# Patient Record
Sex: Male | Born: 1975 | Race: White | Hispanic: No | Marital: Married | Smoking: Never smoker
Health system: Southern US, Community
[De-identification: ages and names within clinical notes are randomized; demographics above are authoritative.]

## PROBLEM LIST (undated history)

## (undated) DIAGNOSIS — G4730 Sleep apnea, unspecified: Secondary | ICD-10-CM

## (undated) DIAGNOSIS — K219 Gastro-esophageal reflux disease without esophagitis: Secondary | ICD-10-CM

## (undated) DIAGNOSIS — E785 Hyperlipidemia, unspecified: Secondary | ICD-10-CM

## (undated) DIAGNOSIS — T7840XA Allergy, unspecified, initial encounter: Secondary | ICD-10-CM

## (undated) DIAGNOSIS — I10 Essential (primary) hypertension: Secondary | ICD-10-CM

## (undated) DIAGNOSIS — J45909 Unspecified asthma, uncomplicated: Secondary | ICD-10-CM

## (undated) HISTORY — DX: Essential (primary) hypertension: I10

## (undated) HISTORY — PX: NO PAST SURGERIES: SHX2092

## (undated) HISTORY — DX: Unspecified asthma, uncomplicated: J45.909

## (undated) HISTORY — DX: Sleep apnea, unspecified: G47.30

## (undated) HISTORY — DX: Gastro-esophageal reflux disease without esophagitis: K21.9

## (undated) HISTORY — DX: Hyperlipidemia, unspecified: E78.5

## (undated) HISTORY — DX: Allergy, unspecified, initial encounter: T78.40XA

---

## 2016-09-01 ENCOUNTER — Encounter: Payer: Self-pay | Admitting: Family Medicine

## 2016-09-01 ENCOUNTER — Telehealth: Payer: Self-pay | Admitting: Family Medicine

## 2016-09-01 ENCOUNTER — Ambulatory Visit (INDEPENDENT_AMBULATORY_CARE_PROVIDER_SITE_OTHER): Payer: 59 | Admitting: Family Medicine

## 2016-09-01 VITALS — BP 129/86 | HR 109 | Temp 98.0°F | Resp 20 | Ht 69.0 in | Wt 308.5 lb

## 2016-09-01 DIAGNOSIS — I10 Essential (primary) hypertension: Secondary | ICD-10-CM

## 2016-09-01 DIAGNOSIS — Z7689 Persons encountering health services in other specified circumstances: Secondary | ICD-10-CM

## 2016-09-01 DIAGNOSIS — Z6841 Body Mass Index (BMI) 40.0 and over, adult: Secondary | ICD-10-CM

## 2016-09-01 DIAGNOSIS — R000 Tachycardia, unspecified: Secondary | ICD-10-CM

## 2016-09-01 DIAGNOSIS — R05 Cough: Secondary | ICD-10-CM | POA: Diagnosis not present

## 2016-09-01 DIAGNOSIS — R059 Cough, unspecified: Secondary | ICD-10-CM

## 2016-09-01 HISTORY — DX: Tachycardia, unspecified: R00.0

## 2016-09-01 HISTORY — DX: Morbid (severe) obesity due to excess calories: E66.01

## 2016-09-01 HISTORY — DX: Essential (primary) hypertension: I10

## 2016-09-01 LAB — CBC WITH DIFFERENTIAL/PLATELET
BASOS PCT: 0.3 % (ref 0.0–3.0)
Basophils Absolute: 0 10*3/uL (ref 0.0–0.1)
EOS PCT: 3.1 % (ref 0.0–5.0)
Eosinophils Absolute: 0.3 10*3/uL (ref 0.0–0.7)
HCT: 46.5 % (ref 39.0–52.0)
Hemoglobin: 15.9 g/dL (ref 13.0–17.0)
LYMPHS ABS: 2.8 10*3/uL (ref 0.7–4.0)
Lymphocytes Relative: 26.4 % (ref 12.0–46.0)
MCHC: 34.3 g/dL (ref 30.0–36.0)
MCV: 87.9 fl (ref 78.0–100.0)
MONO ABS: 0.9 10*3/uL (ref 0.1–1.0)
MONOS PCT: 8.7 % (ref 3.0–12.0)
NEUTROS PCT: 61.5 % (ref 43.0–77.0)
Neutro Abs: 6.6 10*3/uL (ref 1.4–7.7)
Platelets: 379 10*3/uL (ref 150.0–400.0)
RBC: 5.29 Mil/uL (ref 4.22–5.81)
RDW: 14.2 % (ref 11.5–15.5)
WBC: 10.8 10*3/uL — ABNORMAL HIGH (ref 4.0–10.5)

## 2016-09-01 LAB — BASIC METABOLIC PANEL
BUN: 22 mg/dL (ref 6–23)
CALCIUM: 10.4 mg/dL (ref 8.4–10.5)
CO2: 31 mEq/L (ref 19–32)
CREATININE: 1.2 mg/dL (ref 0.40–1.50)
Chloride: 98 mEq/L (ref 96–112)
GFR: 71.76 mL/min (ref >60.00)
GLUCOSE: 116 mg/dL — AB (ref 70–99)
Potassium: 4 mEq/L (ref 3.5–5.1)
SODIUM: 137 meq/L (ref 135–145)

## 2016-09-01 LAB — MICROALBUMIN / CREATININE URINE RATIO
CREATININE, U: 387.7 mg/dL
MICROALB/CREAT RATIO: 0.7 mg/g (ref 0.0–30.0)
Microalb, Ur: 2.9 mg/dL — ABNORMAL HIGH (ref 0.0–1.9)

## 2016-09-01 MED ORDER — LOSARTAN POTASSIUM-HCTZ 100-25 MG PO TABS: 1.0000 | ORAL_TABLET | Freq: Every day | ORAL | 0 refills | Status: DC

## 2016-09-01 NOTE — Progress Notes (Signed)
Patient ID: Christian Escobar, male  DOB: 08-13-1976, 40 y.o.   MRN: [id number] Patient Care Team    Relationship Specialty Notifications Start End  Natalia Leatherwoodenee A Kuneff, DO PCP - General Family Medicine  09/01/16     Subjective:  Christian Escobar is a 40 y.o.  male present for new patient establishment. All past medical history, surgical history, allergies, family history, immunizations, medications and social history were obtained and entered in the electronic medical record today. All recent labs, ED visits and hospitalizations within the last year were reviewed.  Moved from [place] about 2015. He has not established with any provider. Recently seen in UC for pneumonia.  Pneumonia: Pt was recently seen at [facility] and diagnosed with pneumonia. He received steroid, rocephin and azith treatment. He states he is completely improved since treatment. He did have follow-up 2 occassions since. He denies fever, chills, nausea, vomit or fatigue. He does endorse a continued cough.   Hypertension: During his UC visits he was also diagnosed with hypertension. He was started on lisinopril/hctz 20-25 and tolerating medicaiton well. He reports his BP still are rather elevated when he takes them at home. BP ranges 180-160/100-120s during UC visits, and he states his readings at home are higher as well. He endorses having elevated BP a few years ago at last appt with prior provider. He has never been medicated for HTN. His father has HTN. He endorses gaining a good deal of weight over the last few years and leading a more sedentary lifestyle. He had not been monitoring his diet or salt content, working on the road and eating more fast food. Over the last two weeks he has decided to stop taking his health for granted and start exercising. He denies chest pain, shortness of breath, dizziness.   Health maintenance:  Colonoscopy: No Fhx, screen 50 Immunizations: tdap unknown, Influenza declined (encouraged  yearly) Infectious disease screening: HIV screen unknown PSA: No fhx.   There is no immunization history on file for this patient.   Past Medical History:  Diagnosis Date  . Allergy   . Asthma   . Hypertension    No Known Allergies History reviewed. No pertinent surgical history. Family History  Problem Relation Age of Onset  . Hypertension Father    Social History   Social History  . Marital status: Married    Spouse name: N/A  . Number of children: N/A  . Years of education: N/A   Occupational History  . Not on file.   Social History Main Topics  . Smoking status: Never Smoker  . Smokeless tobacco: Never Used  . Alcohol use No  . Drug use: No  . Sexual activity: Yes   Other Topics Concern  . Not on file   Social History Narrative  . No narrative on file     Medication List       Accurate as of 09/01/16  9:02 AM. Always use your most recent med list.          lisinopril-hydrochlorothiazide 20-25 MG tablet Commonly known as:  PRINZIDE,ZESTORETIC Take 1 tablet by mouth daily.   VENTOLIN HFA 108 (90 Base) MCG/ACT inhaler Generic drug:  albuterol        No results found for this or any previous visit (from the past 2160 hour(s)).  Patient was never admitted.   ROS: 14 pt review of systems performed and negative (unless mentioned in an HPI)  Objective: BP 129/86 (BP Location: Left Arm, Patient Position:  Sitting, Cuff Size: Large)   Pulse (!) 109   Temp 98 F (36.7 C)   Resp 20   Ht 5\' 9"  (1.753 m)   Wt (!) 308 lb 8 oz (139.9 kg)   SpO2 99%   BMI 45.56 kg/m  Gen: Afebrile. No acute distress. Nontoxic in appearance, well-developed, well-nourished,  Obese caucasian male, pleasant.  HENT: AT. [place].  MMM.  Cough on exam. Eyes:Pupils Equal Round Reactive to light, Extraocular movements intact,  Conjunctiva without redness, discharge or icterus. Neck/lymp/endocrine: Supple,no lymphadenopathy, no thyromegaly CV: RRR no  murmur, no edema, +2/4 P  posterior tibialis pulses. no carotid bruits. No JVD. Chest: CTAB, no wheeze, rhonchi or crackles. Normal  Respiratory effort. Good  Air movement. Abd: Soft. obese. NTND. BS present Skin: Warm and well-perfused. Skin intact. Neuro/Msk:  Normal gait. PERLA. EOMi. Alert. Oriented x3.   Psych: Normal affect, dress and demeanor. Normal speech. Normal thought content and judgment.  Assessment/plan: Christian Escobar is a 40 y.o. male present for  Establishment of care with new diagnosis of HTN.  Essential hypertension, benign/cough BMI 45.0-49.9, adult (HCC) tachycardia - DC lisinopril-hctz for cough. Start plain mucinex. Cough might be caused by lisinopril vs post PNA cough.  - losartan/HCTZ 100-25 started - Exercise and dietary counseling. Low salt diet with AVS provided.  - Patient was encouraged to exercise greater than 150 minutes a week.  - Opthalmology appt encouraged  - PREP program information provided.  - CBC w/Diff - Basic Metabolic Panel (BMET) - Urine Microalbumin w/creat. Rati - Monitor BP at home, goal 130/80's. AVS provided.    Nurse visit 1 week for BP recheck.  CPE within 4 weeks with fasting lab (lipids, tsh, a1c- Future labs placed)  Greater than 30 minutes spent with patient, >50% of time spent face to face   Electronically signed by: Felix Pacinienee Kuneff, DO [facility]

## 2016-09-01 NOTE — Telephone Encounter (Signed)
Please call pt: - his labs did show some protein in his urine. This is an indication he has had uncontrolled htn for some time. His kidneys are functioning well. The changes seen above can be reversible, once BP is controlled and use of the medication class he is prescribed.  - he does have a very mild elevation in his WBC count, however this is hopefully secondary to the steroids he had been on for his PNA. If cough does not resolve or he becomes fever, chilled etc he should be seen.

## 2016-09-01 NOTE — Patient Instructions (Signed)
Hypertension Hypertension, commonly called high blood pressure, is when the force of blood pumping through your arteries is too strong. Your arteries are the blood vessels that carry blood from your heart throughout your body. A blood pressure reading consists of a higher number over a lower number, such as 110/72. The higher number (systolic) is the pressure inside your arteries when your heart pumps. The lower number (diastolic) is the pressure inside your arteries when your heart relaxes. Ideally you want your blood pressure below 130/80 Hypertension forces your heart to work harder to pump blood. Your arteries may become narrow or stiff. Having untreated or uncontrolled hypertension can cause heart attack, stroke, kidney disease, and other problems. RISK FACTORS Some risk factors for high blood pressure are controllable. Others are not.  Risk factors you cannot control include:   Race. You may be at higher risk if you are African American.  Age. Risk increases with age.  Gender. Men are at higher risk than women before age 29 years. After age 36, women are at higher risk than men. Risk factors you can control include:  Not getting enough exercise or physical activity.  Being overweight.  Getting too much fat, sugar, calories, or salt in your diet.  Drinking too much alcohol. SIGNS AND SYMPTOMS Hypertension does not usually cause signs or symptoms. Extremely high blood pressure (hypertensive crisis) may cause headache, anxiety, shortness of breath, and nosebleed. DIAGNOSIS To check if you have hypertension, your health care provider will measure your blood pressure while you are seated, with your arm held at the level of your heart. It should be measured at least twice using the same arm. Certain conditions can cause a difference in blood pressure between your right and left arms. A blood pressure reading that is higher than normal on one occasion does not mean that you need treatment. If  it is not clear whether you have high blood pressure, you may be asked to return on a different day to have your blood pressure checked again. Or, you may be asked to monitor your blood pressure at home for 1 or more weeks. TREATMENT Treating high blood pressure includes making lifestyle changes and possibly taking medicine. Living a healthy lifestyle can help lower high blood pressure. You may need to change some of your habits. Lifestyle changes may include:  Following the DASH diet. This diet is high in fruits, vegetables, and whole grains. It is low in salt, red meat, and added sugars.  Keep your sodium intake below 2,300 mg per day.  Getting at least 30-45 minutes of aerobic exercise at least 4 times per week.  Losing weight if necessary.  Not smoking.  Limiting alcoholic beverages.  Learning ways to reduce stress. Your health care provider may prescribe medicine if lifestyle changes are not enough to get your blood pressure under control, and if one of the following is true:  You are 22-21 years of age and your systolic blood pressure is above 140.  You are 55 years of age or older, and your systolic blood pressure is above 150.  Your diastolic blood pressure is above 90.  You have diabetes, and your systolic blood pressure is over 140 or your diastolic blood pressure is over 90.  You have kidney disease and your blood pressure is above 140/90.  You have heart disease and your blood pressure is above 140/90. Your personal target blood pressure may vary depending on your medical conditions, your age, and other factors. HOME CARE INSTRUCTIONS  Have your blood pressure rechecked as directed by your health care provider.   Take medicines only as directed by your health care provider. Follow the directions carefully. Blood pressure medicines must be taken as prescribed. The medicine does not work as well when you skip doses. Skipping doses also puts you at risk for  problems.  Do not smoke.   Monitor your blood pressure at home as directed by your health care provider. SEEK MEDICAL CARE IF:   You think you are having a reaction to medicines taken.  You have recurrent headaches or feel dizzy.  You have swelling in your ankles.  You have trouble with your vision. SEEK IMMEDIATE MEDICAL CARE IF:  You develop a severe headache or confusion.  You have unusual weakness, numbness, or feel faint.  You have severe chest or abdominal pain.  You vomit repeatedly.  You have trouble breathing. MAKE SURE YOU:   Understand these instructions.  Will watch your condition.  Will get help right away if you are not doing well or get worse.   This information is not intended to replace advice given to you by your health care provider. Make sure you discuss any questions you have with your health care provider.   Document Released: 10/06/2005 Document Revised: 02/20/2015 Document Reviewed: 07/29/2013 Elsevier Interactive Patient Education 2016 Elsevier Inc.   Low-Sodium Eating Plan Sodium raises blood pressure and causes water to be held in the body. Getting less sodium from food will help lower your blood pressure, reduce any swelling, and protect your heart, liver, and kidneys. We get sodium by adding salt (sodium chloride) to food. Most of our sodium comes from canned, boxed, and frozen foods. Restaurant foods, fast foods, and pizza are also very high in sodium. Even if you take medicine to lower your blood pressure or to reduce fluid in your body, getting less sodium from your food is important. WHAT IS MY PLAN? Most people should limit their sodium intake to 2,000 mg a day.  WHAT DO I NEED TO KNOW ABOUT THIS EATING PLAN? For the low-sodium eating plan, you will follow these general guidelines:  Choose foods with a % Daily Value for sodium of less than 5% (as listed on the food label).   Use salt-free seasonings or herbs instead of table salt  or sea salt.   Check with your health care provider or pharmacist before using salt substitutes.   Eat fresh foods.  Eat more vegetables and fruits.  Limit canned vegetables. If you do use them, rinse them well to decrease the sodium.   Limit cheese to 1 oz (28 g) per day.   Eat lower-sodium products, often labeled as "lower sodium" or "no salt added."  Avoid foods that contain monosodium glutamate (MSG). MSG is sometimes added to [place] food and some canned foods.  Check food labels (Nutrition Facts labels) on foods to learn how much sodium is in one serving.  Eat more home-cooked food and less restaurant, buffet, and fast food.  When eating at a restaurant, ask that your food be prepared with less salt, or no salt if possible.  HOW DO I READ FOOD LABELS FOR SODIUM INFORMATION? The Nutrition Facts label lists the amount of sodium in one serving of the food. If you eat more than one serving, you must multiply the listed amount of sodium by the number of servings. Food labels may also identify foods as:  Sodium free--Less than 5 mg in a serving.  Very low sodium--35  mg or less in a serving.  Low sodium--140 mg or less in a serving.  Light in sodium--50% less sodium in a serving. For example, if a food that usually has 300 mg of sodium is changed to become light in sodium, it will have 150 mg of sodium.  Reduced sodium--25% less sodium in a serving. For example, if a food that usually has 400 mg of sodium is changed to reduced sodium, it will have 300 mg of sodium. WHAT FOODS CAN I EAT? Grains Low-sodium cereals, including oats, puffed wheat and rice, and shredded wheat cereals. Low-sodium crackers. Unsalted rice and pasta. Lower-sodium bread.  Vegetables Frozen or fresh vegetables. Low-sodium or reduced-sodium canned vegetables. Low-sodium or reduced-sodium tomato sauce and paste. Low-sodium or reduced-sodium tomato and vegetable juices.  Fruits Fresh, frozen, and  canned fruit. Fruit juice.  Meat and Other Protein Products Low-sodium canned tuna and salmon. Fresh or frozen meat, poultry, seafood, and fish. Lamb. Unsalted nuts. Dried beans, peas, and lentils without added salt. Unsalted canned beans. Homemade soups without salt. Eggs.  Dairy Milk. Soy milk. Ricotta cheese. Low-sodium or reduced-sodium cheeses. Yogurt.  Condiments Fresh and dried herbs and spices. Salt-free seasonings. Onion and garlic powders. Low-sodium varieties of mustard and ketchup. Fresh or refrigerated horseradish. Lemon juice.  Fats and Oils Reduced-sodium salad dressings. Unsalted butter.  Other Unsalted popcorn and pretzels.  The items listed above may not be a complete list of recommended foods or beverages. Contact your dietitian for more options. WHAT FOODS ARE NOT RECOMMENDED? Grains Instant hot cereals. Bread stuffing, pancake, and biscuit mixes. Croutons. Seasoned rice or pasta mixes. Noodle soup cups. Boxed or frozen macaroni and cheese. Self-rising flour. Regular salted crackers. Vegetables Regular canned vegetables. Regular canned tomato sauce and paste. Regular tomato and vegetable juices. Frozen vegetables in sauces. Salted Jamaica[place] fries. Olives. Rosita FirePickles. Relishes. Sauerkraut. Salsa. Meat and Other Protein Products Salted, canned, smoked, spiced, or pickled meats, seafood, or fish. Bacon, ham, sausage, hot dogs, corned beef, chipped beef, and packaged luncheon meats. Salt pork. Jerky. Pickled herring. Anchovies, regular canned tuna, and sardines. Salted nuts. Dairy Processed cheese and cheese spreads. Cheese curds. Blue cheese and cottage cheese. Buttermilk.  Condiments Onion and garlic salt, seasoned salt, table salt, and sea salt. Canned and packaged gravies. Worcestershire sauce. Tartar sauce. Barbecue sauce. Teriyaki sauce. Soy sauce, including reduced sodium. Steak sauce. Fish sauce. Oyster sauce. Cocktail sauce. Horseradish that you find on the shelf.  Regular ketchup and mustard. Meat flavorings and tenderizers. Bouillon cubes. Hot sauce. Tabasco sauce. Marinades. Taco seasonings. Relishes. Fats and Oils Regular salad dressings. Salted butter. Margarine. Ghee. Bacon fat.  Other Potato and tortilla chips. Corn chips and puffs. Salted popcorn and pretzels. Canned or dried soups. Pizza. Frozen entrees and pot pies.  The items listed above may not be a complete list of foods and beverages to avoid. Contact your dietitian for more information.   This information is not intended to replace advice given to you by your health care provider. Make sure you discuss any questions you have with your health care provider.   Document Released: 03/28/2002 Document Revised: 10/27/2014 Document Reviewed: 08/10/2013 Elsevier Interactive Patient Education [employer]2016 Elsevier Inc.

## 2016-09-02 NOTE — Telephone Encounter (Signed)
Spoke with patient reviewed results and instructions . Patient verbalized understanding. 

## 2016-09-08 ENCOUNTER — Ambulatory Visit: Payer: 59

## 2016-09-10 ENCOUNTER — Ambulatory Visit (INDEPENDENT_AMBULATORY_CARE_PROVIDER_SITE_OTHER): Payer: 59 | Admitting: Family Medicine

## 2016-09-10 ENCOUNTER — Encounter: Payer: Self-pay | Admitting: Family Medicine

## 2016-09-10 VITALS — BP 142/92 | HR 101 | Temp 98.1°F | Resp 16

## 2016-09-10 DIAGNOSIS — Z23 Encounter for immunization: Secondary | ICD-10-CM

## 2016-09-10 DIAGNOSIS — R05 Cough: Secondary | ICD-10-CM

## 2016-09-10 DIAGNOSIS — K219 Gastro-esophageal reflux disease without esophagitis: Secondary | ICD-10-CM | POA: Insufficient documentation

## 2016-09-10 DIAGNOSIS — I10 Essential (primary) hypertension: Secondary | ICD-10-CM

## 2016-09-10 DIAGNOSIS — Z6841 Body Mass Index (BMI) 40.0 and over, adult: Secondary | ICD-10-CM

## 2016-09-10 DIAGNOSIS — R059 Cough, unspecified: Secondary | ICD-10-CM

## 2016-09-10 HISTORY — DX: Gastro-esophageal reflux disease without esophagitis: K21.9

## 2016-09-10 LAB — LIPID PANEL
CHOL/HDL RATIO: 5
Cholesterol: 188 mg/dL (ref 0–200)
HDL: 36.7 mg/dL — AB (ref >39.00)
LDL CALC: 112 mg/dL — AB (ref 0–99)
NONHDL: 150.92
Triglycerides: 193 mg/dL — ABNORMAL HIGH (ref 0.0–149.0)
VLDL: 38.6 mg/dL (ref 0.0–40.0)

## 2016-09-10 LAB — HEMOGLOBIN A1C: Hgb A1c MFr Bld: 6.3 % (ref 4.6–6.5)

## 2016-09-10 LAB — TSH: TSH: 2.33 u[IU]/mL (ref 0.35–4.50)

## 2016-09-10 MED ORDER — LISINOPRIL-HYDROCHLOROTHIAZIDE 20-25 MG PO TABS: 1.0000 | ORAL_TABLET | Freq: Every day | ORAL | 5 refills | Status: DC

## 2016-09-10 MED ORDER — OMEPRAZOLE 40 MG PO CPDR: 40.0000 mg | DELAYED_RELEASE_CAPSULE | Freq: Every day | ORAL | 2 refills | Status: DC

## 2016-09-10 NOTE — Patient Instructions (Signed)
Food Choices for Gastroesophageal Reflux Disease, Adult When you have gastroesophageal reflux disease (GERD), the foods you eat and your eating habits are very important. Choosing the right foods can help ease your discomfort. What guidelines do I need to follow?  Choose fruits, vegetables, whole grains, and low-fat dairy products.  Choose low-fat meat, fish, and poultry.  Limit fats such as oils, salad dressings, butter, nuts, and avocado.  Keep a food diary. This helps you identify foods that cause symptoms.  Avoid foods that cause symptoms. These may be different for everyone.  Eat small meals often instead of 3 large meals a day.  Eat your meals slowly, in a place where you are relaxed.  Limit fried foods.  Cook foods using methods other than frying.  Avoid drinking alcohol.  Avoid drinking large amounts of liquids with your meals.  Avoid bending over or lying down until 2-3 hours after eating. What foods are not recommended? These are some foods and drinks that may make your symptoms worse: Vegetables  Tomatoes. Tomato juice. Tomato and spaghetti sauce. Chili peppers. Onion and garlic. Horseradish. Fruits  Oranges, grapefruit, and lemon (fruit and juice). Meats  High-fat meats, fish, and poultry. This includes hot dogs, ribs, ham, sausage, salami, and bacon. Dairy  Whole milk and chocolate milk. Sour cream. Cream. Butter. Ice cream. Cream cheese. Drinks  Coffee and tea. Bubbly (carbonated) drinks or energy drinks. Condiments  Hot sauce. Barbecue sauce. Sweets/Desserts  Chocolate and cocoa. Donuts. Peppermint and spearmint. Fats and Oils  High-fat foods. This includes Jamaica[place] fries and potato chips. Other  Vinegar. Strong spices. This includes black pepper, white pepper, red pepper, cayenne, curry powder, cloves, ginger, and chili powder. The items listed above may not be a complete list of foods and drinks to avoid. Contact your dietitian for more information.    This information is not intended to replace advice given to you by your health care provider. Make sure you discuss any questions you have with your health care provider. Document Released: 04/06/2012 Document Revised: 03/13/2016 Document Reviewed: 08/10/2013 Elsevier Interactive Patient Education  2017 Elsevier Inc.  Restart lisinopril, refills called in .  Stop losartan.  Follow GERD diet.  Take a daily allegra or zyrtec for allergy symptoms.

## 2016-09-10 NOTE — Progress Notes (Signed)
Pre visit review using our clinic review tool, if applicable. No additional management support is needed unless otherwise documented below in the visit note. 

## 2016-09-10 NOTE — Progress Notes (Signed)
Patient ID: Christian Escobar, male  DOB: 07-15-1976, 40 y.o.   MRN: [id number] Patient Care Team    Relationship Specialty Notifications Start End  Christian Leatherwoodenee A Eileen Kangas, DO PCP - General Family Medicine  09/01/16     Subjective:  Christian Escobar is a 40 y.o.  male present for BP recheck.     Hypertension/cough:  Pt presents for BP recheck today with elevated blood pressure. He was switched from lisinopril to losartan secondary to chronic cough after starting lisinopril. Pt states it has bene 2 weeks and he has had no change in his cough. He does have a h/o of GERD, and is not on medications for GERD. He reports he frequently has heartburn, but it has been improved since he is watching his diet and he has not had heartburn in a few weeks. He was just getting over pneumonia on last visit, bu the reports feeling better. Cough initially started during pneumonia illness.   Prior note:  During his UC visits he was also diagnosed with hypertension. He was started on lisinopril/hctz 20-25 and tolerating medicaiton well. He reports his BP still are rather elevated when he takes them at home. BP ranges 180-160/100-120s during UC visits, and he states his readings at home are higher as well. He endorses having elevated BP a few years ago at last appt with prior provider. He has never been medicated for HTN. His father has HTN. He endorses gaining a good deal of weight over the last few years and leading a more sedentary lifestyle. He had not been monitoring his diet or salt content, working on the road and eating more fast food. Over the last two weeks he has decided to stop taking his health for granted and start exercising. He denies chest pain, shortness of breath, dizziness.     There is no immunization history on file for this patient.   Past Medical History:  Diagnosis Date  . Allergy   . Asthma   . Hypertension    No Known Allergies Past Surgical History:  Procedure Laterality Date  . NO PAST  SURGERIES     Family History  Problem Relation Age of Onset  . Hypertension Father   . Arthritis Father    Social History   Social History  . Marital status: Married    Spouse name: Christian Escobar  . Number of children: 0  . Years of education: 7616   Occupational History  . Engineer    Social History Main Topics  . Smoking status: Never Smoker  . Smokeless tobacco: Never Used  . Alcohol use No  . Drug use: No  . Sexual activity: Yes    Partners: Female     Comment: married   Other Topics Concern  . Not on file   Social History Narrative   Married to [place]. No children.    BS. Div. Chief Financial Officerngineer manager.    Drinks caffeine.    Wears seatbelt, bicycle helmet. Smoke detector in the home.    Exercises 3x a week.    Firearms locked in the home.    Feels safe in his relationships.      Medication List       Accurate as of 09/10/16  8:50 AM. Always use your most recent med list.          losartan-hydrochlorothiazide 100-25 MG tablet Commonly known as:  HYZAAR Take 1 tablet by mouth daily.   VENTOLIN HFA 108 (90 Base) MCG/ACT inhaler Generic drug:  albuterol        Recent Results (from the past 2160 hour(s))  CBC w/Diff     Status: Abnormal   Collection Time: 09/01/16  9:30 AM  Result Value Ref Range   WBC 10.8 (H) 4.0 - 10.5 K/uL   RBC 5.29 4.22 - 5.81 Mil/uL   Hemoglobin 15.9 13.0 - 17.0 g/dL   HCT 40.946.5 81.139.0 - 91.452.0 %   MCV 87.9 78.0 - 100.0 fl   MCHC 34.3 30.0 - 36.0 g/dL   RDW 78.214.2 95.611.5 - 21.315.5 %   Platelets 379.0 150.0 - 400.0 K/uL   Neutrophils Relative % 61.5 43.0 - 77.0 %   Lymphocytes Relative 26.4 12.0 - 46.0 %   Monocytes Relative 8.7 3.0 - 12.0 %   Eosinophils Relative 3.1 0.0 - 5.0 %   Basophils Relative 0.3 0.0 - 3.0 %   Neutro Abs 6.6 1.4 - 7.7 K/uL   Lymphs Abs 2.8 0.7 - 4.0 K/uL   Monocytes Absolute 0.9 0.1 - 1.0 K/uL   Eosinophils Absolute 0.3 0.0 - 0.7 K/uL   Basophils Absolute 0.0 0.0 - 0.1 K/uL  Basic Metabolic Panel (BMET)      Status: Abnormal   Collection Time: 09/01/16  9:30 AM  Result Value Ref Range   Sodium 137 135 - 145 mEq/L   Potassium 4.0 3.5 - 5.1 mEq/L   Chloride 98 96 - 112 mEq/L   CO2 31 19 - 32 mEq/L   Glucose, Bld 116 (H) 70 - 99 mg/dL   BUN 22 6 - 23 mg/dL   Creatinine, Ser 0.861.20 0.40 - 1.50 mg/dL   Calcium 57.810.4 8.4 - 46.910.5 mg/dL   GFR 62.9571.76 >28.41>60.00 mL/min  Urine Microalbumin w/creat. ratio     Status: Abnormal   Collection Time: 09/01/16  9:30 AM  Result Value Ref Range   Microalb, Ur 2.9 (H) 0.0 - 1.9 mg/dL   Creatinine,U 324.4387.7 mg/dL   Microalb Creat Ratio 0.7 0.0 - 30.0 mg/g    Patient was never admitted.   ROS: 14 pt review of systems performed and negative (unless mentioned in an HPI)  Objective: BP (!) 142/92 (BP Location: Right Arm, Patient Position: Sitting, Cuff Size: Large)   Pulse (!) 101   Temp 98.1 F (36.7 C) (Oral)   Resp 16   SpO2 94%  Gen: Afebrile. No acute distress. Nontoxic in appearance, well-developed, well-nourished,  Obese caucasian male, pleasant.  HENT: AT. [place].  MMM.  Bllateral TM WNL. Bilateral nares with mild erythema. Throat without erythema or exudates. Cough on exam. Eyes:Pupils Equal Round Reactive to light, Extraocular movements intact,  Conjunctiva without redness, discharge or icterus. Neck/lymp/endocrine: Supple,no lymphadenopathy CV: RRR  Chest: CTAB, no wheeze, rhonchi or crackles. Normal  Respiratory effort. Good  Air movement. Abd: Soft. obese. NTND. BS present Skin: Warm and well-perfused. Skin intact. Neuro/Msk:  Normal gait. PERLA. EOMi. Alert. Oriented x3.     Assessment/plan: Christian CorrenteMichael Escobar is a 40 y.o. male present for  Establishment of care with new diagnosis of HTN.  Essential hypertension, benign/cough GERD - he should have had resolution of cough after DC acei, unless he is among the few that also has cough with losartan. He has had chronic GERD symptoms and had mild allergy symptoms on exam.  - discussed with him cough that last  like his, is usually medication/gerd/allergy related.  - restart lisinopril-hctz, he had better BP control on this medication. If needed would add BB or CCB with his HR.  - Start  PPI, omeprazole 40 QD. GERD diet.  - Exercise and dietary counseling. Low salt diet with AVS provided.  - Patient was encouraged to exercise greater than 150 minutes a week.  -  Urine Microalbumin w/creat. Rati - Monitor BP at home, goal 130/80's. AVS provided.  - f/U 2-3 months unless BP not controlled.   > 25 minutes spent with patient, >50% of time spent face to face    Electronically signed by: Felix Pacini, DO [facility] [place]

## 2016-09-15 ENCOUNTER — Telehealth: Payer: Self-pay | Admitting: Family Medicine

## 2016-09-15 NOTE — Telephone Encounter (Signed)
Please call pt:  -his labs were collected early for his CPE (scheduled mid Dec)?? - Briefly, his cholesterol is mildly elevated and if he has not started a fish oil supplement I would encourage him to do so about 1 g. His diabetes screen is in the prediabetes range, but he is not a diabetic.  - I will review all results and recs at his appt in detail.

## 2016-09-15 NOTE — Telephone Encounter (Signed)
Patient notified and verbalized understanding. 

## 2016-09-29 ENCOUNTER — Ambulatory Visit (INDEPENDENT_AMBULATORY_CARE_PROVIDER_SITE_OTHER): Payer: 59 | Admitting: Family Medicine

## 2016-09-29 ENCOUNTER — Encounter: Payer: Self-pay | Admitting: Family Medicine

## 2016-09-29 VITALS — BP 128/85 | HR 110 | Temp 98.2°F | Resp 20 | Ht 69.0 in | Wt 294.0 lb

## 2016-09-29 DIAGNOSIS — Z0000 Encounter for general adult medical examination without abnormal findings: Secondary | ICD-10-CM

## 2016-09-29 DIAGNOSIS — R000 Tachycardia, unspecified: Secondary | ICD-10-CM

## 2016-09-29 DIAGNOSIS — R7303 Prediabetes: Secondary | ICD-10-CM

## 2016-09-29 DIAGNOSIS — I10 Essential (primary) hypertension: Secondary | ICD-10-CM | POA: Diagnosis not present

## 2016-09-29 DIAGNOSIS — Z23 Encounter for immunization: Secondary | ICD-10-CM | POA: Diagnosis not present

## 2016-09-29 HISTORY — DX: Prediabetes: R73.03

## 2016-09-29 MED ORDER — METOPROLOL TARTRATE 25 MG PO TABS: 25.0000 mg | ORAL_TABLET | Freq: Two times a day (BID) | ORAL | 2 refills | Status: DC

## 2016-09-29 NOTE — Progress Notes (Signed)
Patient ID: Christian Escobar, male  DOB: June 26, 1976, 40 y.o.   MRN: [id number] Patient Care Team    Relationship Specialty Notifications Start End  Natalia Leatherwoodenee A Lashena Signer, DO PCP - General Family Medicine  09/01/16     Subjective:  Christian Escobar is a 40 y.o. male present for CPE. All past medical history, surgical history, allergies, family history, immunizations, medications and social history were updated in the electronic medical record today. All recent labs, ED visits and hospitalizations within the last year were reviewed.  Essential hypertension, benign/Tachycardia Pt reports compliance with lisinopril-hctz. This is a newer diagnosis for him this year. His cough has resolved, thus was not related to his ACEi use. He reports normal BP ranges at home. He is dieting and exercising and has lost > 12 lbs. He reports his HR has always been elevated above 100. He denies chest pain, shortness of breath or LE edema.   Severe obesity (BMI >= 40) (HCC)/Prediabetes pts a1c is elevated at 6.3 on screening. His fasting blood glucose is elevated, but not diabetic range. He has been dieting and exercising, already lost > 12 lbs. He denies nonhealing wounds, dizziness.   Health maintenance:  Colonoscopy: no fhx, screen at 52 Immunizations:  tdap completed todya, influenza 08/2006 Infectious disease screening: HIV offered PSA: No results found for: PSA, no fhx, discuss screen at 42 Oxygen use: None Patient has a Dental home. Hospitalizations/ED visits: None [facility] 09/[facility]/2016 09/01/2016  Decreased Interest 0 0  Down, Depressed, Hopeless 0 0  PHQ - 2 Score 0 0     Immunization History  Administered Date(s) Administered  . Influenza,inj,Quad PF,36+ Mos 09/10/2016     Past Medical History:  Diagnosis Date  . Allergy   . Asthma   . Hypertension    No Known Allergies Past Surgical History:  Procedure Laterality Date  . NO PAST SURGERIES     Family History  Problem  Relation Age of Onset  . Hypertension Father   . Arthritis Father    Social History   Social History  . Marital status: Married    Spouse name: Natalia LeatherwoodKatherine  . Number of children: 0  . Years of education: 216   Occupational History  . Engineer    Social History Main Topics  . Smoking status: Never Smoker  . Smokeless tobacco: Never Used  . Alcohol use No  . Drug use: No  . Sexual activity: Yes    Partners: Female     Comment: married   Other Topics Concern  . Not on file   Social History Narrative   Married to Christian Escobar. No children.    BS. Div. Chief Financial Officerngineer manager.    Drinks caffeine.    Wears seatbelt, bicycle helmet. Smoke detector in the home.    Exercises 3x a week.    Firearms locked in the home.    Feels safe in his relationships.      Medication List       Accurate as of 09/29/16  8:39 AM. Always use your most recent med list.          baclofen 20 MG tablet Commonly known as:  LIORESAL   ibuprofen 800 MG tablet Commonly known as:  ADVIL,MOTRIN   lisinopril-hydrochlorothiazide 20-25 MG tablet Commonly known as:  PRINZIDE,ZESTORETIC Take 1 tablet by mouth daily.   omeprazole 40 MG capsule Commonly known as:  PRILOSEC Take 1 capsule (40 mg total) by mouth daily.   traMADol 50 MG tablet  Commonly known as:  ULTRAM   VENTOLIN HFA 108 (90 Base) MCG/ACT inhaler Generic drug:  albuterol        Recent Results (from the past 2160 hour(s))  CBC w/Diff     Status: Abnormal   Collection Time: 09/01/16  9:30 AM  Result Value Ref Range   WBC 10.8 (H) 4.0 - 10.5 K/uL   RBC 5.29 4.22 - 5.81 Mil/uL   Hemoglobin 15.9 13.0 - 17.0 g/dL   HCT 16.1 09.6 - 04.5 %   MCV 87.9 78.0 - 100.0 fl   MCHC 34.3 30.0 - 36.0 g/dL   RDW 40.9 81.1 - 91.4 %   Platelets 379.0 150.0 - 400.0 K/uL   Neutrophils Relative % 61.5 43.0 - 77.0 %   Lymphocytes Relative 26.4 12.0 - 46.0 %   Monocytes Relative 8.7 3.0 - 12.0 %   Eosinophils Relative 3.1 0.0 - 5.0 %   Basophils  Relative 0.3 0.0 - 3.0 %   Neutro Abs 6.6 1.4 - 7.7 K/uL   Lymphs Abs 2.8 0.7 - 4.0 K/uL   Monocytes Absolute 0.9 0.1 - 1.0 K/uL   Eosinophils Absolute 0.3 0.0 - 0.7 K/uL   Basophils Absolute 0.0 0.0 - 0.1 K/uL  Basic Metabolic Panel (BMET)     Status: Abnormal   Collection Time: 09/01/16  9:30 AM  Result Value Ref Range   Sodium 137 135 - 145 mEq/L   Potassium 4.0 3.5 - 5.1 mEq/L   Chloride 98 96 - 112 mEq/L   CO2 31 19 - 32 mEq/L   Glucose, Bld 116 (H) 70 - 99 mg/dL   BUN 22 6 - 23 mg/dL   Creatinine, Ser 7.82 0.40 - 1.50 mg/dL   Calcium 95.6 8.4 - 21.3 mg/dL   GFR 08.65 >78.46 mL/min  Urine Microalbumin w/creat. ratio     Status: Abnormal   Collection Time: 09/01/16  9:30 AM  Result Value Ref Range   Microalb, Ur 2.9 (H) 0.0 - 1.9 mg/dL   Creatinine,U 962.9 mg/dL   Microalb Creat Ratio 0.7 0.0 - 30.0 mg/g  TSH     Status: None   Collection Time: 09/10/16  8:21 AM  Result Value Ref Range   TSH 2.33 0.35 - 4.50 uIU/mL  HgB A1c     Status: None   Collection Time: 09/10/16  8:21 AM  Result Value Ref Range   Hgb A1c MFr Bld 6.3 4.6 - 6.5 %    Comment: Glycemic Control Guidelines for People with Diabetes:Non Diabetic:  <6%Goal of Therapy: <7%Additional Action Suggested:  >8%   Lipid panel     Status: Abnormal   Collection Time: 09/10/16  8:21 AM  Result Value Ref Range   Cholesterol 188 0 - 200 mg/dL    Comment: ATP III Classification       Desirable:  < 200 mg/dL               Borderline High:  200 - 239 mg/dL          High:  > = 528 mg/dL   Triglycerides 413.2 (H) 0.0 - 149.0 mg/dL    Comment: Normal:  <440 mg/dLBorderline High:  150 - 199 mg/dL   HDL 10.27 (L) >25.36 mg/dL   VLDL 64.4 0.0 - 03.4 mg/dL   LDL Cholesterol 742 (H) 0 - 99 mg/dL   Total CHOL/HDL Ratio 5     Comment:                Men  Women1/2 Average Risk     3.4          3.3Average Risk          5.0          4.42X Average Risk          9.6          7.13X Average Risk          15.0          11.0                        NonHDL 150.92     Comment: NOTE:  Non-HDL goal should be 30 mg/dL higher than patient's LDL goal (i.e. LDL goal of < 70 mg/dL, would have non-HDL goal of < 100 mg/dL)    Patient was never admitted.   ROS: 14 pt review of systems performed and negative (unless mentioned in an HPI)  Objective: BP 128/85 (BP Location: Left Arm, Patient Position: Sitting, Cuff Size: Large)   Pulse (!) 110   Temp 98.2 F (36.8 C)   Resp 20   Ht 5\' 9"  (1.753 m)   Wt 294 lb (133.4 kg)   SpO2 98%   BMI 43.42 kg/m  Gen: Afebrile. No acute distress. Nontoxic in appearance, well-developed, well-nourished,  Obese, caucasian male.  HENT: AT. [place]. Bilateral TM visualized and normal in appearance, normal external auditory canal. MMM, no oral lesions, adequate dentition. Bilateral nares within normal limits. Throat without erythema, ulcerations or exudates. no Cough on exam, no hoarseness on exam. Eyes:Pupils Equal Round Reactive to light, Extraocular movements intact,  Conjunctiva without redness, discharge or icterus. Neck/lymp/endocrine: Supple,no lymphadenopathy, no thyromegaly CV: RRR no murmur, no edema, +2/4 P posterior tibialis pulses. no carotid bruits. No JVD. Chest: CTAB, no wheeze, rhonchi or crackles. normal Respiratory effort. good Air movement. Abd: Soft. obese. NTND. BS present. no Masses palpated. No hepatosplenomegaly. No rebound tenderness or guarding. Skin: no rashes, purpura or petechiae. Warm and well-perfused. Skin intact. Neuro/Msk:  Normal gait. PERLA. EOMi. Alert. Oriented x3.  Cranial nerves II through XII intact. Muscle strength 5/5 upper/lower extremity. DTRs equal bilaterally. Psych: Normal affect, dress and demeanor. Normal speech. Normal thought content and judgment.   Assessment/plan: Orbie Grupe is a 40 y.o. male present for CPE Encounter for preventative adult health care examination Severe obesity (BMI >= 40) (HCC) Patient was encouraged to exercise greater  than 150 minutes a week. Patient was encouraged to choose a diet filled with fresh fruits and vegetables, and lean meats. AVS provided to patient today for education/recommendation on gender specific health and safety  Colonoscopy: no fhx, screen at 52 Immunizations:  tdap completed today, influenza 08/2006 Infectious disease screening: HIV offered PSA: No results found for: PSA, no fhx, discuss screen at 42 maintenance.  Essential hypertension, benign - stable today.  - continue Lisinopril Hctz - rpt microalbumin in 3 months: 08/2016 abnl.  - BMP yearly, normal - f/u 3 months, if stable can go to 6 month.   Prediabetes:  - discussed diet and exercise.  - He is doing well, already lost ~12 lbs.  - encouraged use of myfittnesspal and caloriecounter.  - A1c rpt in 3 months, prior to rooming (order placed)  Tachycardia:  - His resting HR has routinely been above 105, will start low dose metoprolol 25 mg BID - normal parameters discussed with pt. Ideally resting  HR 60-90.  Return in about 3 months (around 12/28/2016), or  HTN.  Electronically signed by: Felix Pacinienee Tiffny Gemmer, DO [facility] [place]

## 2016-09-29 NOTE — Patient Instructions (Signed)
Continue BP medications, your blood pressure looks good on this medicine.  Exercise > 150 minutes a week.  Low salt diet.  followup every 3 months on BP, if remains stable on next check will be able to stretch out farther.  Yearly physical.    Health Maintenance, Male A healthy lifestyle and preventative care can promote health and wellness.  Maintain regular health, dental, and eye exams.  Eat a healthy diet. Foods like vegetables, fruits, whole grains, low-fat dairy products, and lean protein foods contain the nutrients you need and are low in calories. Decrease your intake of foods high in solid fats, added sugars, and salt. Get information about a proper diet from your health care provider, if necessary.  Regular physical exercise is one of the most important things you can do for your health. Most adults should get at least 150 minutes of moderate-intensity exercise (any activity that increases your heart rate and causes you to sweat) each week. In addition, most adults need muscle-strengthening exercises on 2 or more days a week.   Maintain a healthy weight. The body mass index (BMI) is a screening tool to identify possible weight problems. It provides an estimate of body fat based on height and weight. Your health care provider can find your BMI and can help you achieve or maintain a healthy weight. For males 20 years and older:  A BMI below 18.5 is considered underweight.  A BMI of 18.5 to 24.9 is normal.  A BMI of 25 to 29.9 is considered overweight.  A BMI of 30 and above is considered obese.  Maintain normal blood lipids and cholesterol by exercising and minimizing your intake of saturated fat. Eat a balanced diet with plenty of fruits and vegetables. Blood tests for lipids and cholesterol should begin at age 56 and be repeated every 5 years. If your lipid or cholesterol levels are high, you are over age 78, or you are at high risk for heart disease, you may need your  cholesterol levels checked more frequently.Ongoing high lipid and cholesterol levels should be treated with medicines if diet and exercise are not working.  If you smoke, find out from your health care provider how to quit. If you do not use tobacco, do not start.  Lung cancer screening is recommended for adults aged 55-80 years who are at high risk for developing lung cancer because of a history of smoking. A yearly low-dose CT scan of the lungs is recommended for people who have at least a 30-pack-year history of smoking and are current smokers or have quit within the past 15 years. A pack year of smoking is smoking an average of 1 pack of cigarettes a day for 1 year (for example, a 30-pack-year history of smoking could mean smoking 1 pack a day for 30 years or 2 packs a day for 15 years). Yearly screening should continue until the smoker has stopped smoking for at least 15 years. Yearly screening should be stopped for people who develop a health problem that would prevent them from having lung cancer treatment.  If you choose to drink alcohol, do not have more than 2 drinks per day. One drink is considered to be 12 oz (360 mL) of beer, 5 oz (150 mL) of wine, or 1.5 oz (45 mL) of liquor.  Avoid the use of street drugs. Do not share needles with anyone. Ask for help if you need support or instructions about stopping the use of drugs.  High blood pressure  causes heart disease and increases the risk of stroke. High blood pressure is more likely to develop in:  People who have blood pressure in the end of the normal range (100-139/85-89 mm Hg).  People who are overweight or obese.  People who are African American.  If you are 4718-38 years of age, have your blood pressure checked every 3-5 years. If you are 38 years of age or older, have your blood pressure checked every year. You should have your blood pressure measured twice-once when you are at a hospital or clinic, and once when you are not at a  hospital or clinic. Record the average of the two measurements. To check your blood pressure when you are not at a hospital or clinic, you can use:  An automated blood pressure machine at a pharmacy.  A home blood pressure monitor.  If you are 2745-38 years old, ask your health care provider if you should take aspirin to prevent heart disease.  Diabetes screening involves taking a blood sample to check your fasting blood sugar level. This should be done once every 3 years after age 38 if you are at a normal weight and without risk factors for diabetes. Testing should be considered at a younger age or be carried out more frequently if you are overweight and have at least 1 risk factor for diabetes.  Colorectal cancer can be detected and often prevented. Most routine colorectal cancer screening begins at the age of 38 and continues through age 38. However, your health care provider may recommend screening at an earlier age if you have risk factors for colon cancer. On a yearly basis, your health care provider may provide home test kits to check for hidden blood in the stool. A small camera at the end of a tube may be used to directly examine the colon (sigmoidoscopy or colonoscopy) to detect the earliest forms of colorectal cancer. Talk to your health care provider about this at age 38 when routine screening begins. A direct exam of the colon should be repeated every 5-10 years through age 38, unless early forms of precancerous polyps or small growths are found.  People who are at an increased risk for hepatitis B should be screened for this virus. You are considered at high risk for hepatitis B if:  You were born in a country where hepatitis B occurs often. Talk with your health care provider about which countries are considered high risk.  Your parents were born in a high-risk country and you have not received a shot to protect against hepatitis B (hepatitis B vaccine).  You have HIV or AIDS.  You  use needles to inject street drugs.  You live with, or have sex with, someone who has hepatitis B.  You are a man who has sex with other men (MSM).  You get hemodialysis treatment.  You take certain medicines for conditions like cancer, organ transplantation, and autoimmune conditions.  Hepatitis C blood testing is recommended for all people born from [id number] through 1963 and any individual with known risk factors for hepatitis C.  Healthy men should no longer receive prostate-specific antigen (PSA) blood tests as part of routine cancer screening. Talk to your health care provider about prostate cancer screening.  Testicular cancer screening is not recommended for adolescents or adult males who have no symptoms. Screening includes self-exam, a health care provider exam, and other screening tests. Consult with your health care provider about any symptoms you have or any concerns you have  about testicular cancer.  Practice safe sex. Use condoms and avoid high-risk sexual practices to reduce the spread of sexually transmitted infections (STIs).  You should be screened for STIs, including gonorrhea and chlamydia if:  You are sexually active and are younger than 24 years.  You are older than 24 years, and your health care provider tells you that you are at risk for this type of infection.  Your sexual activity has changed since you were last screened, and you are at an increased risk for chlamydia or gonorrhea. Ask your health care provider if you are at risk.  If you are at risk of being infected with HIV, it is recommended that you take a prescription medicine daily to prevent HIV infection. This is called pre-exposure prophylaxis (PrEP). You are considered at risk if:  You are a man who has sex with other men (MSM).  You are a heterosexual man who is sexually active with multiple partners.  You take drugs by injection.  You are sexually active with a partner who has HIV.  Talk with  your health care provider about whether you are at high risk of being infected with HIV. If you choose to begin PrEP, you should first be tested for HIV. You should then be tested every 3 months for as long as you are taking PrEP.  Use sunscreen. Apply sunscreen liberally and repeatedly throughout the day. You should seek shade when your shadow is shorter than you. Protect yourself by wearing long sleeves, pants, a wide-brimmed hat, and sunglasses year round whenever you are outdoors.  Tell your health care provider of new moles or changes in moles, especially if there is a change in shape or color. Also, tell your health care provider if a mole is larger than the size of a pencil eraser.  A one-time screening for abdominal aortic aneurysm (AAA) and surgical repair of large AAAs by ultrasound is recommended for men aged 65-75 years who are current or former smokers.  Stay current with your vaccines (immunizations). This information is not intended to replace advice given to you by your health care provider. Make sure you discuss any questions you have with your health care provider. Document Released: 04/03/2008 Document Revised: 10/27/2014 Document Reviewed: 07/10/2015 Elsevier Interactive Patient Education  2017 [employer]

## 2016-12-16 ENCOUNTER — Other Ambulatory Visit: Payer: Self-pay | Admitting: *Deleted

## 2016-12-16 MED ORDER — OMEPRAZOLE 40 MG PO CPDR: 40.0000 mg | DELAYED_RELEASE_CAPSULE | Freq: Every day | ORAL | 2 refills | Status: DC

## 2016-12-22 ENCOUNTER — Ambulatory Visit: Payer: 59 | Admitting: Family Medicine

## 2017-01-05 ENCOUNTER — Ambulatory Visit: Payer: 59 | Admitting: Family Medicine

## 2017-01-09 ENCOUNTER — Other Ambulatory Visit: Payer: Self-pay | Admitting: *Deleted

## 2017-01-09 MED ORDER — LISINOPRIL-HYDROCHLOROTHIAZIDE 20-25 MG PO TABS: 1.0000 | ORAL_TABLET | Freq: Every day | ORAL | 0 refills | Status: DC

## 2017-01-09 MED ORDER — METOPROLOL TARTRATE 25 MG PO TABS: 25.0000 mg | ORAL_TABLET | Freq: Two times a day (BID) | ORAL | 0 refills | Status: DC

## 2017-01-19 ENCOUNTER — Telehealth: Payer: Self-pay

## 2017-01-19 ENCOUNTER — Encounter: Payer: Self-pay | Admitting: Family Medicine

## 2017-01-19 ENCOUNTER — Ambulatory Visit (INDEPENDENT_AMBULATORY_CARE_PROVIDER_SITE_OTHER): Payer: 59 | Admitting: Family Medicine

## 2017-01-19 VITALS — BP 132/92 | HR 112 | Temp 98.8°F | Resp 20 | Wt 310.5 lb

## 2017-01-19 DIAGNOSIS — I10 Essential (primary) hypertension: Secondary | ICD-10-CM | POA: Diagnosis not present

## 2017-01-19 DIAGNOSIS — R0683 Snoring: Secondary | ICD-10-CM | POA: Diagnosis not present

## 2017-01-19 DIAGNOSIS — R000 Tachycardia, unspecified: Secondary | ICD-10-CM | POA: Diagnosis not present

## 2017-01-19 DIAGNOSIS — G4733 Obstructive sleep apnea (adult) (pediatric): Secondary | ICD-10-CM

## 2017-01-19 HISTORY — DX: Obstructive sleep apnea (adult) (pediatric): G47.33

## 2017-01-19 MED ORDER — METOPROLOL TARTRATE 25 MG PO TABS: 25.0000 mg | ORAL_TABLET | Freq: Two times a day (BID) | ORAL | 0 refills | Status: DC

## 2017-01-19 MED ORDER — LISINOPRIL-HYDROCHLOROTHIAZIDE 20-25 MG PO TABS: 1.0000 | ORAL_TABLET | Freq: Every day | ORAL | 0 refills | Status: DC

## 2017-01-19 NOTE — Patient Instructions (Addendum)
http://www.calculator.net/calorie-calculator.[email or web address]--> calorie counter.    Please help [place] help you:  We are honored you have chosen [facility] for your Primary Care home. Below you will find basic instructions that you may need to access in the future. Please help [place] help you by reading the instructions, which cover many of the frequent questions we experience.   Prescription refills and request:  -In order to allow more efficient response time, please call your pharmacy for all refills. They will forward the request electronically to [place]. This allows for the quickest possible response. Request left on a nurse line can take longer to refill, since these are checked as time allows between office patients and other phone calls.  - refill request can take up to 3-5 working days to complete.  - If request is sent electronically and request is appropiate, it is usually completed in 1-2 business days.  - all patients will need to be seen routinely for all chronic medical conditions requiring prescription medications (see follow-up below). If you are overdue for follow up on your condition, you will be asked to make an appointment and we will call in enough medication to cover you until your appointment (up to 30 days).  - all controlled substances will require a face to face visit to request/refill.  - if you desire your prescriptions to go through a new pharmacy, and have an active script at original pharmacy, you will need to call your pharmacy and have scripts transferred to new pharmacy. This is completed between the pharmacy locations and not by your provider.    Results: If any images or labs were ordered, it can take up to 1 week to get results depending on the test ordered and the lab/facility running and resulting the test. - Normal or stable results, which do not need further discussion, will be released to your mychart immediately with attached note to you. A call will not be generated for  normal results. Please make certain to sign up for mychart. If you have questions on how to activate your mychart you can call the front office.  - If your results need further discussion, our office will attempt to contact you via phone, and if unable to reach you after 2 attempts, we will release your abnormal result to your mychart with instructions.  - All results will be automatically released in mychart after 1 week.  - Your provider will provide you with explanation and instruction on all relevant material in your results. Please keep in mind, results and labs may appear confusing or abnormal to the untrained eye, but it does not mean they are actually abnormal for you personally. If you have any questions about your results that are not covered, or you desire more detailed explanation than what was provided, you should make an appointment with your provider to do so.   Our office handles many outgoing and incoming calls daily. If we have not contacted you within 1 week about your results, please check your mychart to see if there is a message first and if not, then contact our office.  In helping with this matter, you help decrease call volume, and therefore allow [place] to be able to respond to patients needs more efficiently.   Acute office visits (sick visit):  An acute visit is intended for a new problem and are scheduled in shorter time slots to allow schedule openings for patients with new problems. This is the appropriate visit to discuss a new problem.  In order to provide you with excellent quality medical care with proper time for you to explain your problem, have an exam and receive treatment with instructions, these appointments should be limited to one new problem per visit. If you experience a new problem, in which you desire to be addressed, please make an acute office visit, we save openings on the schedule to accommodate you. Please do not save your new problem for any other type of  visit, let us take care of it properly and quickly for you.   Follow up visits:  Depending on your condition(s) your provider will need to see you routinely in order to provide you with quality care and prescribe medication(s). Most chronic conditions (Example: hypertension, Diabetes, depression/anxiety... etc), require visits a couple times a year. Your provider will instruct you on proper follow up for your personal medical conditions and history. Please make certain to make follow up appointments for your condition as instructed. Failing to do so could result in lapse in your medication treatment/refills. If you request a refill, and are overdue to be seen on a condition, we will always provide you with a 30 day script (once) to allow you time to schedule.    Medicare wellness (well visit): - we have a wonderful Nurse Selena Batten), that will meet with you and provide you will yearly medicare wellness visits. These visits should occur yearly (can not be scheduled less than 1 calendar year apart) and cover preventive health, immunizations, advance directives and screenings you are entitled to yearly through your medicare benefits. Do not miss out on your entitled benefits, this is when medicare will pay for these benefits to be ordered for you.  These are strongly encouraged by your provider and is the appropriate type of visit to make certain you are up to date with all preventive health benefits. If you have not had your medicare wellness exam in the last 12 months, please make certain to schedule one by calling the office and schedule your medicare wellness with Selena Batten as soon as possible.   Yearly physical (well visit):  - Adults are recommended to be seen yearly for physicals. Check with your insurance and date of your last physical, most insurances require one calendar year between physicals. Physicals include all preventive health topics, screenings, medical exam and labs that are appropriate for gender/age  and history. You may have fasting labs needed at this visit. This is a well visit (not a sick visit), acute topics should not be covered during this visit.  - Pediatric patients are seen more frequently when they are younger. Your provider will advise you on well child visit timing that is appropriate for your their age. - This is not a medicare wellness visit. Medicare wellness exams do not have an exam portion to the visit. Some medicare companies allow for a physical, some do not allow a yearly physical. If your medicare allows a yearly physical you can schedule the medicare wellness with our nurse Selena Batten and have your physical with your provider after, on the same day. Please check with insurance for your full benefits.   Late Policy/No Shows:  - all new patients should arrive 15-30 minutes earlier than appointment to allow [place] time  to  obtain all personal demographics,  insurance information and for you to complete office paperwork. - All established patients should arrive 10-15 minutes earlier than appointment time to update all information and be checked in .  - In our best efforts to run  on time, if you are late for your appointment you will be asked to either reschedule or if able, we will work you back into the schedule. There will be a wait time to work you back in the schedule,  depending on availability.  - If you are unable to make it to your appointment as scheduled, please call 24 hours ahead of time to allow [place] to fill the time slot with someone else who needs to be seen. If you do not cancel your appointment ahead of time, you may be charged a no show fee.

## 2017-01-19 NOTE — Telephone Encounter (Signed)
Christian Escobar has called today to talk about the PREP that he's been referred in to by his PMD.  He has made an appointment to come in this coming Friday 01/23/17, at 8:30am to register.

## 2017-01-19 NOTE — Progress Notes (Signed)
Patient ID: Christian Escobar, male  DOB: Sep 24, 1976, 41 y.o.   MRN: [id number] Patient Care Team    Relationship Specialty Notifications Start End  Natalia Leatherwood, DO PCP - General Family Medicine  09/01/16     Subjective:  Christian Escobar is a 41 y.o. male present for HTN follow up.    Essential hypertension, benign/Tachycardia Pt reports compliance with lisinopril-hctz Today. He has been out of his metoprolol for approximately 2 weeks. Since we saw him last approximately 3 months ago he has regained the 15 pounds he had lost. He states he was traveling more and again the weight back. He has now switched jobs approximately 3 weeks ago in which he will no longer have to travel. He states he is motivated and wants to start losing weight and get focused on his health again. He reports normal blood pressures at home below 140/90, when he is taking all of his medications. He denies chest pain, shortness of breath, dizziness or lower extremity edema. He does mention snoring and his wife witnessed pain possibly apneic spells in the middle of the night. He is wondering if he needs a sleep study. He states this has been getting worse over the last 2 months.  Prior note: This is a newer diagnosis for him this year. His cough has resolved, thus was not related to his ACEi use. He reports normal BP ranges at home. He is dieting and exercising and has lost > 12 lbs. He reports his HR has always been elevated above 100. He denies chest pain, shortness of breath or LE edema.     Depression screen [facility] 2/9 09/29/2016 09/01/2016  Decreased Interest 0 0  Down, Depressed, Hopeless 0 0  PHQ - 2 Score 0 0     Immunization History  Administered Date(s) Administered  . Influenza,inj,Quad PF,36+ Mos 09/10/2016  . Tdap 09/29/2016     Past Medical History:  Diagnosis Date  . Allergy   . Asthma   . Hypertension    No Known Allergies Past Surgical History:  Procedure Laterality Date  . NO PAST  SURGERIES     Family History  Problem Relation Age of Onset  . Hypertension Father   . Arthritis Father    Social History   Social History  . Marital status: Married    Spouse name: Natalia Leatherwood  . Number of children: 0  . Years of education: 68   Occupational History  . Engineer    Social History Main Topics  . Smoking status: Never Smoker  . Smokeless tobacco: Never Used  . Alcohol use No  . Drug use: No  . Sexual activity: Yes    Partners: Female     Comment: married   Other Topics Concern  . Not on file   Social History Narrative   Married to [place]. No children.    BS. Div. Chief Financial Officer.    Drinks caffeine.    Wears seatbelt, bicycle helmet. Smoke detector in the home.    Exercises 3x a week.    Firearms locked in the home.    Feels safe in his relationships.    Allergies as of 01/19/2017   No Known Allergies     Medication List       Accurate as of 01/19/17  1:00 PM. Always use your most recent med list.          baclofen 20 MG tablet Commonly known as:  LIORESAL   ibuprofen 800 MG tablet  Commonly known as:  ADVIL,MOTRIN   lisinopril-hydrochlorothiazide 20-25 MG tablet Commonly known as:  PRINZIDE,ZESTORETIC Take 1 tablet by mouth daily.   metoprolol tartrate 25 MG tablet Commonly known as:  LOPRESSOR Take 1 tablet (25 mg total) by mouth 2 (two) times daily.   omeprazole 40 MG capsule Commonly known as:  PRILOSEC Take 1 capsule (40 mg total) by mouth daily.   VENTOLIN HFA 108 (90 Base) MCG/ACT inhaler Generic drug:  albuterol        No results found for this or any previous visit (from the past 2160 hour(s)).  Patient was never admitted.   ROS: 14 pt review of systems performed and negative (unless mentioned in an HPI)  Objective: BP (!) 132/92 (BP Location: Left Arm, Patient Position: Sitting, Cuff Size: Large)   Pulse (!) 112   Temp 98.8 F (37.1 C)   Resp 20   Wt (!) 310 lb 8 oz (140.8 kg)   SpO2 98%   BMI 45.85 kg/m    Gen: Afebrile. No acute distress. Nontoxic in appearance, morbidly obese, pleasant Caucasian male. HENT: AT. [place].  MMM.  Eyes:Pupils Equal Round Reactive to light, Extraocular movements intact,  Conjunctiva without redness, discharge or icterus. Neck/lymp/endocrine: Supple, thick neck.  CV: RRR, no edema, +2/4 P posterior tibialis pulses Chest: CTAB, no wheeze or crackles Abd: Soft. obese. NTND. BS present.  Neuro: Normal gait. PERLA. EOMi. Alert. Oriented x3  Assessment/plan: Christian Escobar is a 41 y.o. male present for HTN. Severe obesity (BMI >= 40) (HCC) Snoring Discussed sleep study/pulmonary referral for him. In addition this seems to have worsened since he regained the last 15 pounds over last 3 months. She was given the option to have a referral placed now, versus waiting until next follow-up in 3 months, thus allowing giving himself from time to lose the weight. He would like to wait. Patient was encouraged to exercise greater than 150 minutes a week. PREP program provided to pt today. In addition encouraged him to use the calorie counter and myfitnesspal app   Essential hypertension, benign/tachycardia - Mildly above goal today on repeat BP reading. Patient has been out of his metoprolol for 2 weeks. He is to check on when he is to have this prescription, he is waiting on the mailing service. If going to be too long, can call in a short term prescription to local pharmacy. I suspect once he restarts all of his medications as blood pressure will be normal. He is encouraged to monitor this after medication received, and a follow-up 140/90 he is to be seen immediately. - continue Lisinopril Hctz, refills provided today - rpt microalbumin in 3 months: 08/2016 abnl.  - BMP yearly, normal - f/u 3 months, if stable can go to 6 month.    Return in about 3 months (around 04/20/2017), or HTN/snoring. .  Electronically signed by: Felix Pacini, DO [facility] [place]

## 2017-01-23 NOTE — Progress Notes (Signed)
[facility] PREP Progress Report   Patient Details  Name: Christian Escobar MRN: [id number] Date of Birth: 03-04-1976 Age: 41 y.o. PCP: Felix Pacini, DO  Vitals:   01/23/17 1124  BP: (!) 128/98  Pulse: 73  Resp: 18  SpO2: 97%  Weight: (!) 314 lb 3.2 oz (142.5 kg)  Height:  (1.753 m)         [name] Eval - 01/23/17 1100      Referral    Referring Provider Dr. Claiborne Billings   Reason for referral Hypertension;Obesitity/Overweight;Inactivity   Program Start Date 01/23/17     Measurement   Neck measurement 18.75 Inches   Waist Circumference 55.5 inches     Information for Trainer   Goals "Drop weight to 190-210 range, get of BP meds, feel more energetic   Current Exercise none   Orthopedic Concerns none   Pertinent Medical History "hypertension"   Current Barriers "motivation and consistency"   Medications that affect exercise Beta blocker     Timed Up and Go (TUGS)   Timed Up and Go Low risk <9 seconds     Mobility and Daily Activities   I find it easy to walk up or down two or more flights of stairs. 4   I have no trouble taking out the trash. 4   I do housework such as vacuuming and dusting on my own without difficulty. 4   I can easily lift a gallon of milk (8lbs). 4   I can easily walk a mile. 3   I have no trouble reaching into high cupboards or reaching down to pick up something from the floor. 4   I do not have trouble doing out-door work such as Loss adjuster, chartered, raking leaves, or gardening. 3     Mobility and Daily Activities   I feel younger than my age. 3   I feel independent. 4   I feel energetic. 2   I live an active life.  2   I feel strong. 3   I feel healthy. 2   I feel active as other people my age. 2     How fit and strong are you.   Fit and Strong Total Score 44     Past Medical History:  Diagnosis Date  . Allergy   . Asthma   . Hypertension    Past Surgical History:  Procedure Laterality Date  . NO PAST SURGERIES     History   Smoking Status  . Never Smoker  Smokeless Tobacco  . Never Used   Christian Escobar has registered to day for the PREP.  He has stated that he is looking forward to the consistency of weekly classes as well as the accountability.  He also states he will restart to track his nutritional intake via a mobile app (Lose It) that he has on his mobile device.  His trainer should contact him by early next week to start his personal training program.    Rose Fillers 01/23/2017, 11:28 AM

## 2017-01-28 NOTE — Progress Notes (Signed)
[facility] PREP Weekly Session   Patient Details  Name: Christian Escobar MRN: [id number] Date of Birth: Oct 16, 1976 Age: 41 y.o. PCP: Felix Pacini, DO  Vitals:   01/28/17 1244  Weight: (!) 314 lb (142.4 kg)        [name] Weekly seesion - 01/28/17 1200      Weekly Session   Topic Discussed Finding support   Minutes exercised this week 35 minutes  cardio   Classes attended to date 1      Things you are grateful for:"starting" Nutrition celebrations for the week:"I started!" Barriers:"Just starting!  1st day!"  Rose Fillers 01/28/2017, 12:45 PM

## 2017-02-04 NOTE — Progress Notes (Signed)
[facility] PREP Weekly Session   Patient Details  Name: Christian Escobar MRN: [id number] Date of Birth: 11-Mar-1976 Age: 41 y.o. PCP: Felix Pacini, DO  Vitals:   02/04/17 1326  Weight: (!) 313 lb (142 kg)        [name] Weekly seesion - 02/04/17 1300      Weekly Session   Topic Discussed Calorie breakdown   Minutes exercised this week 90 minutes  cardio   Classes attended to date 2     Fun things you did since last meeting:"yardwork in great weather" Things you are grateful for:"Coworker in hospital for kidney stones...grateful I don't have them" Nutrition celebrations for the week:"bought healthy groceries for the week, changing diet this week" Barriers:"work taking mind off fitness"  Rose Fillers 02/04/2017, 1:26 PM

## 2017-02-18 NOTE — Progress Notes (Signed)
[facility] PREP Weekly Session   Patient Details  Name: Christian Escobar MRN: [id number] Date of Birth: Apr 24, 1976 Age: 41 y.o. PCP: Felix Pacini, DO  Vitals:   02/18/17 1055  Weight: (!) 312 lb (141.5 kg)        [name] Weekly seesion - 02/18/17 1000      Weekly Session   Topic Discussed Other  fat calories   Minutes exercised this week 190 minutes  120cardio/70strength   Classes attended to date 3     Fun things you did since last meeting:"went fishing" Things you are grateful for:"getting back into the gym" Nutrition celebrations for the week:"no eating out for lunch.  Planned meals" Barriers:"soreness"   Rose Fillers 02/18/2017, 10:56 AM

## 2017-02-27 NOTE — Progress Notes (Signed)
[facility]   Patient Details  Name: Christian Escobar MRN: [id number] Date of Birth: 11-Jul-1976 Age: 41 y.o. PCP: Natalia LeatherwoodKuneff, Renee A, DO  Vitals:   02/27/17 0925  Weight: (!) 309 lb (140.2 kg)        [name] Weekly seesion - 02/27/17 0900      Weekly Session   Topic Discussed Healthy eating tips   Minutes exercised this week 180 minutes  120cardio/60strength   Classes attended to date 4      Fun things you did since last meeting:"scheduled vacation" Things you are grateful for:"great weather, starting to see scale move!" Nutrition celebrations for the week:"cooked all meals for this week on Sunday night" Barriers:"weekend adherence to eating clean"  Rose FillersDebbie Collyn Selk 02/27/2017, 9:26 AM

## 2017-03-14 NOTE — Progress Notes (Signed)
[facility]   Patient Details  Name: Christian Escobar MRN: [id number] Date of Birth: 07-Feb-1976 Age: 41 y.o. PCP: Natalia LeatherwoodKuneff, Renee A, DO  Vitals:   03/09/17 1630  Weight: (!) 314 lb (142.4 kg)        [name] Weekly seesion - 03/14/17 2300      Weekly Session   Topic Discussed Health habits   Minutes exercised this week 260 minutes  200cardio/60strength   Classes attended to date 5     Fun things you did since last meeting:"went to the beach" Things you are grateful for:"to be back on my weightloss schedule" Nutrition celebrations:"vacation eating is over" Barriers:"Ate poorly on vacation, gained back 7lbs!:( "   Rose FillersDebbie Burley Kopka 03/14/2017, 11:01 PM

## 2017-03-27 NOTE — Progress Notes (Signed)
[facility]   Patient Details  Name: Christian Escobar MRN: [id number] Date of Birth: Dec 27, 1975 Age: 41 y.o. PCP: Natalia LeatherwoodKuneff, Renee A, DO  Vitals:   03/23/17 1201  Weight: (!) 308 lb (139.7 kg)        [name] Weekly seesion - 03/27/17 1200      Weekly Session   Topic Discussed Stress management and problem solving   Minutes exercised this week 600 minutes  180cardio x 2 weeks/120strength x 2weeks   Classes attended to date 6     Things you are grateful for:"good health" Nutrition celebrations for the week:"started and sticking with am cardio" Barriers:"work hours interfering w/workouts"   Christian FillersDebbie [name] 03/27/2017, 12:03 PM

## 2017-04-01 NOTE — Progress Notes (Signed)
[facility]   Patient Details  Name: Christian Escobar MRN: [id number] Date of Birth: 10/31/1975 Age: 41 y.o. PCP: Natalia LeatherwoodKuneff, Renee A, DO  Vitals:   03/30/17 0904  Weight: (!) 306 lb (138.8 kg)        [name] Weekly seesion - 04/01/17 0900      Weekly Session   Topic Discussed Other  Guest speaker   Minutes exercised this week 240 minutes  180cardio/60strength   Classes attended to date 7     Fun things you did since last meeting:"slept in!" Things you are grateful for:"health" Nutrition celebrations for the week:"meal prep again" Barriers:"work priorities"   Rose FillersDebbie Masud Holub 04/01/2017, 9:05 AM

## 2017-04-15 NOTE — Progress Notes (Signed)
[facility]   Patient Details  Name: Burnett CorrenteMichael Lange MRN: [id number] Date of Birth: 07-10-1976 Age: 41 y.o. PCP: Natalia LeatherwoodKuneff, Renee A, DO  Vitals:   04/15/17 1019  Weight: (!) 309 lb (140.2 kg)        [name] Weekly seesion - 04/15/17 1000      Weekly Session   Topic Discussed Expectations and non-scale victories   Minutes exercised this week 180 minutes  60cardio/120strength   Classes attended to date 8     Fun things you did since last meeting:"just worked:( "  Things you are grateful for:"not having indigestion after cleaning diet back up"  Nutrition celebrations for the week:"got back on track this week after failing this past week"  Barriers:"fell off the horse last week.atelectasis poorly, missed 2 workouts...gained weight back"  Rose FillersDebbie Valeriano Bain 04/15/2017, 10:19 AM

## 2017-05-08 NOTE — Progress Notes (Signed)
[facility]   Patient Details  Name: Christian CorrenteMichael Escobar MRN: [id number] Date of Birth: [birth year] Age: 41 y.o. PCP: Natalia LeatherwoodKuneff, Renee A, DO  Vitals:   05/08/17 0908  Weight: (!) 307 lb (139.3 kg)        [name] Weekly seesion - 05/08/17 0900      Weekly Session   Topic Discussed Finding support   Minutes exercised this week 120 minutes  careio   Classes attended to date 669     Fun things you did since last meeting:"went swimming" Things you are grateful for:"air conditioning!" Celebrations for the week:"bought new running shoes" Barriers:"work hours!"  Rose FillersDebbie Kasi Lasky 05/08/2017, 9:09 AM

## 2017-06-15 ENCOUNTER — Other Ambulatory Visit: Payer: Self-pay | Admitting: Family Medicine

## 2017-06-15 DIAGNOSIS — R000 Tachycardia, unspecified: Secondary | ICD-10-CM

## 2017-06-15 DIAGNOSIS — I10 Essential (primary) hypertension: Secondary | ICD-10-CM

## 2017-09-14 ENCOUNTER — Other Ambulatory Visit: Payer: Self-pay | Admitting: Family Medicine

## 2017-09-14 DIAGNOSIS — R000 Tachycardia, unspecified: Secondary | ICD-10-CM

## 2017-09-14 DIAGNOSIS — I10 Essential (primary) hypertension: Secondary | ICD-10-CM

## 2019-09-20 DIAGNOSIS — U071 COVID-19: Secondary | ICD-10-CM

## 2019-09-20 HISTORY — DX: COVID-19: U07.1

## 2019-10-28 ENCOUNTER — Ambulatory Visit: Payer: 59 | Admitting: Family Medicine

## 2019-11-07 ENCOUNTER — Ambulatory Visit: Payer: 59 | Admitting: Family Medicine

## 2019-11-18 ENCOUNTER — Encounter: Payer: Self-pay | Admitting: Family Medicine

## 2019-11-18 ENCOUNTER — Ambulatory Visit (INDEPENDENT_AMBULATORY_CARE_PROVIDER_SITE_OTHER): Payer: 59 | Admitting: Family Medicine

## 2019-11-18 ENCOUNTER — Other Ambulatory Visit: Payer: Self-pay

## 2019-11-18 ENCOUNTER — Other Ambulatory Visit: Payer: Self-pay | Admitting: Family Medicine

## 2019-11-18 VITALS — BP 158/108 | HR 101 | Temp 98.0°F | Resp 17 | Ht 69.0 in | Wt 320.5 lb

## 2019-11-18 DIAGNOSIS — R0681 Apnea, not elsewhere classified: Secondary | ICD-10-CM

## 2019-11-18 DIAGNOSIS — R000 Tachycardia, unspecified: Secondary | ICD-10-CM

## 2019-11-18 DIAGNOSIS — R7303 Prediabetes: Secondary | ICD-10-CM

## 2019-11-18 DIAGNOSIS — Z23 Encounter for immunization: Secondary | ICD-10-CM

## 2019-11-18 DIAGNOSIS — R0683 Snoring: Secondary | ICD-10-CM | POA: Diagnosis not present

## 2019-11-18 DIAGNOSIS — I10 Essential (primary) hypertension: Secondary | ICD-10-CM

## 2019-11-18 DIAGNOSIS — R635 Abnormal weight gain: Secondary | ICD-10-CM

## 2019-11-18 LAB — BASIC METABOLIC PANEL
BUN: 20 mg/dL (ref 6–23)
CO2: 27 mEq/L (ref 19–32)
Calcium: 9.9 mg/dL (ref 8.4–10.5)
Chloride: 101 mEq/L (ref 96–112)
Creatinine, Ser: 0.99 mg/dL (ref 0.40–1.50)
GFR: 82.95 mL/min (ref >60.00)
Glucose, Bld: 92 mg/dL (ref 70–99)
Potassium: 4.2 mEq/L (ref 3.5–5.1)
Sodium: 138 mEq/L (ref 135–145)

## 2019-11-18 LAB — LIPID PANEL
Cholesterol: 244 mg/dL — ABNORMAL HIGH (ref 0–200)
HDL: 29.7 mg/dL — ABNORMAL LOW (ref >39.00)
LDL Cholesterol: 186 mg/dL — ABNORMAL HIGH (ref 0–99)
NonHDL: 214.36
Total CHOL/HDL Ratio: 8
Triglycerides: 143 mg/dL (ref 0.0–149.0)
VLDL: 28.6 mg/dL (ref 0.0–40.0)

## 2019-11-18 LAB — T3, FREE: T3, Free: 4 pg/mL (ref 2.3–4.2)

## 2019-11-18 LAB — HEMOGLOBIN A1C: Hgb A1c MFr Bld: 6.2 % (ref 4.6–6.5)

## 2019-11-18 LAB — CBC
HCT: 45.5 % (ref 39.0–52.0)
Hemoglobin: 15.5 g/dL (ref 13.0–17.0)
MCHC: 34.1 g/dL (ref 30.0–36.0)
MCV: 88.8 fl (ref 78.0–100.0)
Platelets: 294 10*3/uL (ref 150.0–400.0)
RBC: 5.13 Mil/uL (ref 4.22–5.81)
RDW: 14.2 % (ref 11.5–15.5)
WBC: 7.4 10*3/uL (ref 4.0–10.5)

## 2019-11-18 LAB — T4, FREE: Free T4: 1.02 ng/dL (ref 0.60–1.60)

## 2019-11-18 LAB — TSH: TSH: 2.52 u[IU]/mL (ref 0.35–4.50)

## 2019-11-18 MED ORDER — LISINOPRIL-HYDROCHLOROTHIAZIDE 20-25 MG PO TABS: 1.0000 | ORAL_TABLET | Freq: Every day | ORAL | 0 refills | Status: DC

## 2019-11-18 MED ORDER — METOPROLOL TARTRATE 25 MG PO TABS: 25.0000 mg | ORAL_TABLET | Freq: Two times a day (BID) | ORAL | 0 refills | Status: DC

## 2019-11-18 NOTE — Telephone Encounter (Signed)
Please call patient: His liver, kidney, electrolytes, thyroid and blood cell counts are all normal. His A1c/diabetes screen is still in the prediabetic range at 6.2. His cholesterol is extremely high with a total cholesterol of 244 and a bad/LDL cholesterol 186.  It is highly recommended he start a cholesterol medication.  This has been called in for him (pended-please send after speaking with him).  With his levels of cholesterol he is it a high risk of heart attack or stroke and this medication will not only help lower his cholesterol but provide him cardiovascular protection.   -I will discuss his goal cholesterol and his results in detail at his follow-up appointment in a couple weeks for his blood pressure.  Please make sure he has scheduled follow-up for 4 weeks for his blood pressure and in 3 months for his cholesterol recheck after starting medication.

## 2019-11-18 NOTE — Progress Notes (Signed)
Patient ID: Christian Escobar, male  DOB: 07/24/1976, 44 y.o.   MRN: [id number] Patient Care Team    Relationship Specialty Notifications Start End  Natalia Leatherwood, DO PCP - General Family Medicine  09/01/16     Subjective:  Christian Escobar is a 44 y.o. male present for HTN follow up after lost to follow up since 2018. Pt reports he originally did start the medications for his BP. He also attended the PREP program and Lost a great deal of weight. He states he was able to get his weight down to 250. He stopped taking his medications at that time. After a recent illness with COVID- he became scared, knowing he was at increased risk secondary to his weight and hypertension. He has gained back all the weight he lost prior. He did start a diet a few weeks ago and reports losing about 15 lbs. He is walking daily for exercise.     Essential hypertension, benign/Tachycardia/obesity Pt had been prescribed lisiniopril-hctz 20-25 and metoprolol 25 mg. He has not taken these in years. Patient denies chest pain, shortness of breath, dizziness or lower extremity edema.  Labs collected today.   Snoring apneic spells:  He had signs of sleep apnea a few years ago, but wanted to wait on referral or sleep study to try and lose weight first. He now states his wife has recorded him and his apneic spells and showed him. This concerned him and he is now ready for a referral.  Prior note: Discussed sleep study/pulmonary referral for him. In addition this seems to have worsened since he regained the last 15 pounds over last 3 months. She was given the option to have a referral placed now, versus waiting until next follow-up in 3 months, thus allowing giving himself from time to lose the weight. He would like to wait. Patient was encouraged to exercise greater than 150 minutes a week. PREP program provided to pt today. In addition encouraged him to use the calorie counter and myfitnesspal app Depression screen [facility] 2/9  11/18/2019 09/29/2016 09/01/2016  Decreased Interest 0 0 0  Down, Depressed, Hopeless 0 0 0  PHQ - 2 Score 0 0 0     Immunization History  Administered Date(s) Administered  . Influenza,inj,Quad PF,6+ Mos 09/10/2016, 11/18/2019  . Tdap 09/29/2016     Past Medical History:  Diagnosis Date  . Allergy   . Asthma   . COVID-19 09/2019  . Hypertension    No Known Allergies Past Surgical History:  Procedure Laterality Date  . NO PAST SURGERIES     Family History  Problem Relation Age of Onset  . Hypertension Father   . Arthritis Father    Social History   Socioeconomic History  . Marital status: Married    Spouse name: Natalia Leatherwood  . Number of children: 0  . Years of education: 60  . Highest education level: Not on file  Occupational History  . Occupation: Art gallery manager  Tobacco Use  . Smoking status: Never Smoker  . Smokeless tobacco: Never Used  Substance and Sexual Activity  . Alcohol use: No  . Drug use: No  . Sexual activity: Yes    Partners: Female    Comment: married  Other Topics Concern  . Not on file  Social History Narrative   Married to Christian Escobar. No children.    BS. Div. Chief Financial Officer.    Drinks caffeine.    Wears seatbelt, bicycle helmet. Smoke detector in the home.  Exercises 3x a week.    Firearms locked in the home.    Feels safe in his relationships.    Social Determinants of Health   Financial Resource Strain:   . Difficulty of Paying Living Expenses: Not on file  Food Insecurity:   . Worried About Charity fundraiser in the Last Year: Not on file  . Ran Out of Food in the Last Year: Not on file  Transportation Needs:   . Lack of Transportation (Medical): Not on file  . Lack of Transportation (Non-Medical): Not on file  Physical Activity:   . Days of Exercise per Week: Not on file  . Minutes of Exercise per Session: Not on file  Stress:   . Feeling of Stress : Not on file  Social Connections:   . Frequency of Communication with  Friends and Family: Not on file  . Frequency of Social Gatherings with Friends and Family: Not on file  . Attends Religious Services: Not on file  . Active Member of Clubs or Organizations: Not on file  . Attends Archivist Meetings: Not on file  . Marital Status: Not on file  Intimate Partner Violence:   . Fear of Current or Ex-Partner: Not on file  . Emotionally Abused: Not on file  . Physically Abused: Not on file  . Sexually Abused: Not on file   Allergies as of 11/18/2019   No Known Allergies     Medication List       Accurate as of November 18, 2019  8:27 AM. If you have any questions, ask your nurse or doctor.        STOP taking these medications   baclofen 20 MG tablet Commonly known as: LIORESAL Stopped by: Howard Pouch, DO   ibuprofen 800 MG tablet Commonly known as: ADVIL Stopped by: Howard Pouch, DO     TAKE these medications   lisinopril-hydrochlorothiazide 20-25 MG tablet Commonly known as: ZESTORETIC TAKE 1 TABLET DAILY   metoprolol tartrate 25 MG tablet Commonly known as: LOPRESSOR TAKE 1 TABLET TWICE A DAY   omeprazole 40 MG capsule Commonly known as: PRILOSEC Take 1 capsule (40 mg total) by mouth daily.   Ventolin HFA 108 (90 Base) MCG/ACT inhaler Generic drug: albuterol        No results found for this or any previous visit (from the past 2160 hour(s)).  Patient was never admitted.   ROS: 14 pt review of systems performed and negative (unless mentioned in an HPI)  Objective: BP (!) 158/108 (BP Location: Left Arm, Patient Position: Sitting, Cuff Size: Large) Comment (Cuff Size): X large  Pulse (!) 101   Temp 98 F (36.7 C) (Temporal)   Resp 17   Ht 5\' 9"  (1.753 m)   Wt (!) 320 lb 8 oz (145.4 kg)   SpO2 95%   BMI 47.33 kg/m   Gen: Afebrile. No acute distress. Nontoxic, obese caucasian male.  HENT: AT. [place]. Thick neck Eyes:Pupils Equal Round Reactive to light, Extraocular movements intact,  Conjunctiva without redness,  discharge or icterus. Neck/lymp/endocrine: Supple,no lymphadenopathy, no thyromegaly CV: RRR no murmur, no edema, +2/4 P posterior tibialis pulses Chest: CTAB, no wheeze or crackles Abd: Sof. NTND. BS present. .  Skin: no rashes, purpura or petechiae.  Neuro:  Normal gait. PERLA. EOMi. Alert. Oriented x3 Psych: Normal affect, dress and demeanor. Normal speech. Normal thought content and judgment.  Assessment/plan: Lewis Keats is a 44 y.o. male present for HTN. Need for influenza vaccination -  Flu Vaccine QUAD 36+ mos IM  Essential hypertension, benign/tachycardia/obesity - has been off medications for a few years and lost to follow up.  - CBC - Basic Metabolic Panel (BMET) - TSH - T3, free - T4, free - Lipid panel - metoprolol tartrate (LOPRESSOR) 25 MG tablet; Take 1 tablet (25 mg total) by mouth 2 (two) times daily.  Dispense: 180 tablet; Refill: 0 - lisinopril-hydrochlorothiazide (ZESTORETIC) 20-25 MG tablet; Take 1 tablet by mouth daily.  Dispense: 90 tablet; Refill: 0   Snoring/Apneic spells  - weight loss is essential.  - referral placed today for further eval.  - Ambulatory referral to Pulmonology  Prediabetes/weight gain - lengthy conversation on diet and exercise today.  - mediterranean diet and low glycemic index diet encouraged.  - Hemoglobin A1c   No follow-ups on file. Orders Placed This Encounter  Procedures  . Flu Vaccine QUAD 36+ mos IM  . CBC  . Basic Metabolic Panel (BMET)  . TSH  . T3, free  . T4, free  . Lipid panel  . Hemoglobin A1c  . Ambulatory referral to Pulmonology   Meds ordered this encounter  Medications  . metoprolol tartrate (LOPRESSOR) 25 MG tablet    Sig: Take 1 tablet (25 mg total) by mouth 2 (two) times daily.    Dispense:  180 tablet    Refill:  0  . lisinopril-hydrochlorothiazide (ZESTORETIC) 20-25 MG tablet    Sig: Take 1 tablet by mouth daily.    Dispense:  90 tablet    Refill:  0    Referral Orders     Ambulatory  referral to Pulmonology  Electronically signed by: Felix Pacini, DO [facility]

## 2019-11-18 NOTE — Patient Instructions (Addendum)
For carbs - educate yourself on glycemic index. A lower glycemic index is best. Your body needs carbs- but good and healthy ones.  Pulmonary referral placed today for sleep study.   We will call you with lab results.   Follow up 4 weeks.   COVID-19 Vaccine Information can be found at: [email or web address] For questions related to vaccine distribution or appointments, please email [email or web address] or call [phone].  Covid Vaccine appointment go to [email or web address].   Mediterranean Diet A Mediterranean diet refers to food and lifestyle choices that are based on the traditions of countries located on the [employer]. This way of eating has been shown to help prevent certain conditions and improve outcomes for people who have chronic diseases, like kidney disease and heart disease. What are tips for following this plan? Lifestyle  Cook and eat meals together with your family, when possible.  Drink enough fluid to keep your urine clear or pale yellow.  Be physically active every day. This includes: ? Aerobic exercise like running or swimming. ? Leisure activities like gardening, walking, or housework.  Get 7-8 hours of sleep each night.  If recommended by your health care provider, drink red wine in moderation. This means 1 glass a day for nonpregnant women and 2 glasses a day for men. A glass of wine equals 5 oz (150 mL). Reading food labels   Check the serving size of packaged foods. For foods such as rice and pasta, the serving size refers to the amount of cooked product, not dry.  Check the total fat in packaged foods. Avoid foods that have saturated fat or trans fats.  Check the ingredients list for added sugars, such as corn syrup. Shopping  At the grocery store, buy most of your food from the areas near the walls of the store. This includes: ? Fresh fruits and vegetables (produce). ? Grains,  beans, nuts, and seeds. Some of these may be available in unpackaged forms or large amounts (in bulk). ? Fresh seafood. ? Poultry and eggs. ? Low-fat dairy products.  Buy whole ingredients instead of prepackaged foods.  Buy fresh fruits and vegetables in-season from local farmers markets.  Buy frozen fruits and vegetables in resealable bags.  If you do not have access to quality fresh seafood, buy precooked frozen shrimp or canned fish, such as tuna, salmon, or sardines.  Buy small amounts of raw or cooked vegetables, salads, or olives from the deli or salad bar at your store.  Stock your pantry so you always have certain foods on hand, such as olive oil, canned tuna, canned tomatoes, rice, pasta, and beans. Cooking  Cook foods with extra-virgin olive oil instead of using butter or other vegetable oils.  Have meat as a side dish, and have vegetables or grains as your main dish. This means having meat in small portions or adding small amounts of meat to foods like pasta or stew.  Use beans or vegetables instead of meat in common dishes like chili or lasagna.  Experiment with different cooking methods. Try roasting or broiling vegetables instead of steaming or sauteing them.  Add frozen vegetables to soups, stews, pasta, or rice.  Add nuts or seeds for added healthy fat at each meal. You can add these to yogurt, salads, or vegetable dishes.  Marinate fish or vegetables using olive oil, lemon juice, garlic, and fresh herbs. Meal planning   Plan to eat 1 vegetarian meal one day each week. Try to work up to 2 vegetarian meals, if possible.  Eat seafood 2 or more times a week.  Have healthy snacks readily available, such as: ? Vegetable sticks with hummus. ? [place] yogurt. ? Fruit and nut trail mix.  Eat balanced meals throughout the week. This includes: ? Fruit: 2-3 servings a day ? Vegetables: 4-5 servings a day ? Low-fat dairy: 2 servings a day ? Fish, poultry, or lean  meat: 1 serving a day ? Beans and legumes: 2 or more servings a week ? Nuts and seeds: 1-2 servings a day ? Whole grains: 6-8 servings a day ? Extra-virgin olive oil: 3-4 servings a day  Limit red meat and sweets to only a few servings a month What are my food choices?  Mediterranean diet ? Recommended  Grains: Whole-grain pasta. Brown rice. Bulgar wheat. Polenta. Couscous. Whole-wheat bread. [place].  Vegetables: Artichokes. Beets. Broccoli. Cabbage. Carrots. Eggplant. Green beans. Chard. Kale. Spinach. Onions. Leeks. Peas. Squash. Tomatoes. Peppers. Radishes.  Fruits: Apples. Apricots. Avocado. Berries. Bananas. Cherries. Dates. Figs. Grapes. Lemons. Melon. Oranges. Peaches. Plums. Pomegranate.  Meats and other protein foods: Beans. Almonds. Sunflower seeds. Pine nuts. Peanuts. [place]. Salmon. Scallops. Shrimp. [place]. Tilapia. Clams. Oysters. Eggs.  Dairy: Low-fat milk. Cheese. Greek yogurt.  Beverages: Water. Red wine. Herbal tea.  Fats and oils: Extra virgin olive oil. Avocado oil. Grape seed oil.  Sweets and desserts: [place] yogurt with honey. Baked apples. Poached pears. Trail mix.  Seasoning and other foods: Basil. Cilantro. Coriander. Cumin. Mint. Parsley. Sage. Rosemary. Tarragon. Garlic. Oregano. Thyme. Pepper. Balsalmic vinegar. Tahini. Hummus. Tomato sauce. Olives. Mushrooms. ? Limit these  Grains: Prepackaged pasta or rice dishes. Prepackaged cereal with added sugar.  Vegetables: Deep fried potatoes (french fries).  Fruits: Fruit canned in syrup.  Meats and other protein foods: Beef. Pork. Lamb. Poultry with skin. Hot dogs. [place] Salines.  Dairy: Ice cream. Sour cream. Whole milk.  Beverages: Juice. Sugar-sweetened soft drinks. Beer. Liquor and spirits.  Fats and oils: Butter. Canola oil. Vegetable oil. Beef fat (tallow). Lard.  Sweets and desserts: Cookies. Cakes. Pies. Candy.  Seasoning and other foods: Mayonnaise. Premade sauces and marinades. The items  listed may not be a complete list. Talk with your dietitian about what dietary choices are right for you. Summary  The Mediterranean diet includes both food and lifestyle choices.  Eat a variety of fresh fruits and vegetables, beans, nuts, seeds, and whole grains.  Limit the amount of red meat and sweets that you eat.  Talk with your health care provider about whether it is safe for you to drink red wine in moderation. This means 1 glass a day for nonpregnant women and 2 glasses a day for men. A glass of wine equals 5 oz (150 mL). This information is not intended to replace advice given to you by your health care provider. Make sure you discuss any questions you have with your health care provider. Document Revised: 06/05/2016 Document Reviewed: 05/29/2016 Elsevier Patient Education  [place].

## 2019-11-21 MED ORDER — ATORVASTATIN CALCIUM 40 MG PO TABS: 40.0000 mg | ORAL_TABLET | Freq: Every day | ORAL | 3 refills | Status: DC

## 2019-11-21 NOTE — Telephone Encounter (Signed)
Pt was called and given information, he verbalized understanding. He has F/U appt scheduled and would like medication sent to [facility]

## 2019-11-23 ENCOUNTER — Encounter: Payer: Self-pay | Admitting: Family Medicine

## 2019-11-25 ENCOUNTER — Encounter: Payer: Self-pay | Admitting: Family Medicine

## 2019-12-01 ENCOUNTER — Encounter: Payer: Self-pay | Admitting: Family Medicine

## 2019-12-01 ENCOUNTER — Ambulatory Visit: Payer: 59 | Admitting: Family Medicine

## 2019-12-01 ENCOUNTER — Other Ambulatory Visit: Payer: Self-pay

## 2019-12-01 VITALS — BP 107/74 | HR 92 | Temp 97.9°F | Resp 17 | Ht 69.0 in | Wt 308.4 lb

## 2019-12-01 DIAGNOSIS — R0789 Other chest pain: Secondary | ICD-10-CM

## 2019-12-01 DIAGNOSIS — R000 Tachycardia, unspecified: Secondary | ICD-10-CM

## 2019-12-01 DIAGNOSIS — R7989 Other specified abnormal findings of blood chemistry: Secondary | ICD-10-CM

## 2019-12-01 DIAGNOSIS — E782 Mixed hyperlipidemia: Secondary | ICD-10-CM

## 2019-12-01 DIAGNOSIS — N179 Acute kidney failure, unspecified: Secondary | ICD-10-CM

## 2019-12-01 DIAGNOSIS — H6121 Impacted cerumen, right ear: Secondary | ICD-10-CM

## 2019-12-01 DIAGNOSIS — I10 Essential (primary) hypertension: Secondary | ICD-10-CM | POA: Diagnosis not present

## 2019-12-01 DIAGNOSIS — R748 Abnormal levels of other serum enzymes: Secondary | ICD-10-CM | POA: Diagnosis not present

## 2019-12-01 DIAGNOSIS — H9201 Otalgia, right ear: Secondary | ICD-10-CM

## 2019-12-01 LAB — COMPREHENSIVE METABOLIC PANEL
ALT: 103 U/L — ABNORMAL HIGH (ref 0–53)
AST: 44 U/L — ABNORMAL HIGH (ref 0–37)
Albumin: 4.7 g/dL (ref 3.5–5.2)
Alkaline Phosphatase: 61 U/L (ref 39–117)
BUN: 15 mg/dL (ref 6–23)
CO2: 30 mEq/L (ref 19–32)
Calcium: 10 mg/dL (ref 8.4–10.5)
Chloride: 100 mEq/L (ref 96–112)
Creatinine, Ser: 1.22 mg/dL (ref 0.40–1.50)
GFR: 65.17 mL/min (ref >60.00)
Glucose, Bld: 97 mg/dL (ref 70–99)
Potassium: 4.1 mEq/L (ref 3.5–5.1)
Sodium: 136 mEq/L (ref 135–145)
Total Bilirubin: 0.7 mg/dL (ref 0.2–1.2)
Total Protein: 7.6 g/dL (ref 6.0–8.3)

## 2019-12-01 LAB — CK: Total CK: 126 U/L (ref 7–232)

## 2019-12-01 MED ORDER — LISINOPRIL-HYDROCHLOROTHIAZIDE 20-25 MG PO TABS: 1.0000 | ORAL_TABLET | Freq: Every day | ORAL | 0 refills | Status: DC

## 2019-12-01 MED ORDER — DEBROX 6.5 % OT SOLN: 5.0000 [drp] | Freq: Two times a day (BID) | OTIC | 0 refills | Status: DC

## 2019-12-01 MED ORDER — ISOSORBIDE MONONITRATE ER 30 MG PO TB24: 30.0000 mg | ORAL_TABLET | Freq: Every day | ORAL | 1 refills | Status: DC

## 2019-12-01 MED ORDER — METOPROLOL TARTRATE 25 MG PO TABS: 25.0000 mg | ORAL_TABLET | Freq: Two times a day (BID) | ORAL | 0 refills | Status: DC

## 2019-12-01 NOTE — Patient Instructions (Signed)
We will call you with lab results.  Keep up the good work with exercise.    HYDRATE.

## 2019-12-01 NOTE — Progress Notes (Signed)
This visit occurred during the SARS-CoV-2 public health emergency.  Safety protocols were in place, including screening questions prior to the visit, additional usage of staff PPE, and extensive cleaning of exam room while observing appropriate contact time as indicated for disinfecting solutions.      Christian Escobar , 04-25-1976, 44 y.o., male MRN: [id number] Patient Care Team    Relationship Specialty Notifications Start End  Ma Hillock, DO PCP - General Family Medicine  09/01/16   Rhetta Mura., MD Referring Physician Cardiology  12/01/19     Chief Complaint  Patient presents with  . Hospitalization Follow-up    Pt was admitted from 02/05-02/08. Pt was exercising and began to have chest pain, stopped when rested. Expierenced the same chest pain the next day and he went to the hospital. Stress test was completed, normal. Went to cardiologist. Stopped Statin      Subjective:  Christian Escobar  is a 44 y.o. male presents for hospital follow up after recent admission on 11/24/2018 for primary diagnosis chest pain on exertion.  Patient was discharged on 11/28/2019 to home. Patients discharge summary has been reviewed, as well as all labs/image studies obtained during hospitalization.   Patients hospital course: Patient was seen in the emergency room for chest pain with exertion.  He has recently made some lifestyle changes and started backing into exercise over the last week.  During his walk he developed mid to left-sided chest pain.  He denies any radiation of pain to arms, neck or jaw.  He denied any shortness of breath associated with this chest pain.  After he went home he rested and the pain improved on its own.  The pain lasted about 5 minutes and was more of a aching discomfort.  Overnight his chest pain did not recur, however when he went into work the next day he felt a little weak and had some additional chest pain at that time.  In his ED visit he had a normal chest x-ray,  normal troponin, normal CK-MB, normal BNP and D-dimer.  His EKG showed questionable accelerated junctional rhythm with flipped T waves.  CBC unremarkable.  CMP had mildly low potassium and mildly low sodium.  He had elevated liver enzymes, elevated CK and an increase in his creatinine.  Patient also had an echocardiogram which was essentially unremarkable and a cardiac stress test which was essentially normal.  He was told to follow-up with cardiology outpatient. Since hospital discharge patient reports patient reports he has been doing well.  He now feels that his chest discomfort is likely from an increase in his exercise routine.  He states the discomfort is still mildly present and is worsened when he pushes on his sternum or left side of his chest.  He states that he had really been taking his new exercise routine seriously.  He has lost 12 pounds since his physical 2 weeks ago.  He is choosing healthy options for his diet.  In addition patient complains of right ear pain.  He states he was having itchy ears while in the hospital and they gave him a long Q-tip in which she has been using in his ear.  He denies any ringing in his ear or loss of hearing.  He denies any drainage.  He denies fever, chills, nausea or vomit or upper respiratory symptoms.  EKG 11/25/2019 Ventricular Rate 69 Atrial Rate 69 P-R Interval 130 QRS Duration 90 Q-T Interval 420 QTC Calculation(Bazett) 450 Calculated P Axis 3  Calculated R Axis 42 Calculated T Axis 29  Diagnosis Normal sinus rhythm Nonspecific T wave abnormality Abnormal ECG When compared with ECG of 25-Nov-2019 10:31, Sinus rhythm has replaced Junctional rhythm Nonspecific T wave abnormality has replaced inverted T waves in Inferior leads Nonspecific T wave abnormality has replaced inverted T waves in Lateral leads QT has lengthened Cosby, Proby ([id number]) on 06/23/7653 6:50:35 PM certifies that he/she has reviewed the ECG tracing and confirms the  independent interpretation is  Diagnostics:  CXR, PA and lateral, 11/25/2019 IMPRESSION: Normal  Echocardiogram, 11/25/2019  Left Ventricle Normal left ventricle size. Wall thickness is normal. EF: 55-60%. Wall motion is within normal limits. Doppler parameters consistent with mild diastolic dysfunction and low to normal LA pressure.  Right Ventricle Right ventricle cavity appears normal. Systolic function is normal.  Left Atrium Left atrium cavity size is normal.  Right Atrium Normal sized right atrium.  Mitral Valve The leaflets are not thickened. There is no mitral regurgitation.  Tricuspid Valve The tricuspid valve was not well visualized. There is no regurgitation. Unable to assess RVSP due to incomplete Doppler signal.  Aortic Valve The leaflets are not thickened. There is no regurgitation or stenosis.  Pulmonic Valve The pulmonic valve was not well visualized.  Ascending Aorta The aorta appears normal in size.  Pericardium There is no pericardial effusion.   NM Stress Test, 11/28/2019  FINDINGS: There is normal physiologic distribution of activity throughout the left ventricular myocardium. No evidence of ischemia. No regional wall motion abnormality. Ejection fraction of 60%.  IMPRESSION: Exam within normal limits   Depression screen [facility] 2/9 11/18/2019 09/29/2016 09/01/2016  Decreased Interest 0 0 0  Down, Depressed, Hopeless 0 0 0  PHQ - 2 Score 0 0 0    No Known Allergies Social History   Tobacco Use  . Smoking status: Never Smoker  . Smokeless tobacco: Never Used  Substance Use Topics  . Alcohol use: No   Past Medical History:  Diagnosis Date  . Allergy   . Asthma   . COVID-19 09/2019  . Hypertension    Past Surgical History:  Procedure Laterality Date  . NO PAST SURGERIES     Family History  Problem Relation Age of Onset  . Hypertension Father   . Arthritis Father    Allergies as of 12/01/2019   No Known Allergies     Medication List        Accurate as of December 01, 2019 11:59 PM. If you have any questions, ask your nurse or doctor.        STOP taking these medications   atorvastatin 40 MG tablet Commonly known as: LIPITOR Stopped by: Howard Pouch, DO   omeprazole 40 MG capsule Commonly known as: PRILOSEC Stopped by: Howard Pouch, DO   Ventolin HFA 108 (90 Base) MCG/ACT inhaler Generic drug: albuterol Stopped by: Howard Pouch, DO     TAKE these medications   Aspirin Adult Low Strength 81 MG EC tablet Generic drug: aspirin Take 81 mg by mouth daily.   Debrox 6.5 % OTIC solution Generic drug: carbamide peroxide Place 5 drops into the right ear 2 (two) times daily. Started by: Howard Pouch, DO   isosorbide mononitrate 30 MG 24 hr tablet Commonly known as: IMDUR Take 1 tablet (30 mg total) by mouth daily. What changed:   how much to take  when to take this Changed by: Howard Pouch, DO   lisinopril-hydrochlorothiazide 20-25 MG tablet Commonly known as: ZESTORETIC Take 1 tablet by mouth  daily.   metoprolol tartrate 25 MG tablet Commonly known as: LOPRESSOR Take 1 tablet (25 mg total) by mouth 2 (two) times daily.       All past medical history, surgical history, allergies, family history, immunizations and medications were updated in the EMR today and reviewed under the history and medication portions of their EMR.      ROS: Negative, with the exception of above mentioned in HPI   Objective:  BP 107/74 (BP Location: Left Arm, Patient Position: Sitting, Cuff Size: Large)   Pulse 92   Temp 97.9 F (36.6 C) (Temporal)   Resp 17   Ht 5' 9"  (1.753 m)   Wt (!) 308 lb 6 oz (139.9 kg)   SpO2 96%   BMI 45.54 kg/m  Body mass index is 45.54 kg/m. Gen: Afebrile. No acute distress. Nontoxic in appearance, well developed, well nourished.  Very pleasant obese Caucasian man. HENT: AT. [place].  MMM.  Bilateral TMs visualized and normal in appearance.  Right EAC with mild cerumen debris in canal and small  amount of cerumen against the tympanic membrane. Eyes:Pupils Equal Round Reactive to light, Extraocular movements intact,  Conjunctiva without redness, discharge or icterus. Neck/lymp/endocrine: Supple, no lymphadenopathy CV: RRR no murmur, no edema Chest: CTAB, no wheeze or crackles. Good air movement, normal resp effort.  Abd: Soft.  Obese. NTND. BS present.  MSK: Reproducible chest discomfort with palpation of chest wall. Neuro:  Normal gait. PERLA. EOMi. Alert. Oriented x3  Psych: Normal affect, dress and demeanor. Normal speech. Normal thought content and judgment.  Assessment/Plan: Heru Montz is a 44 y.o. male present for OV for Hospital discharge follow up Essential hypertension, benign/Severe obesity (BMI >= 40) (HCC)/hyperlipidemia/tachycardia/chest discomfort Stable. Continue metoprolol Continue lisinopril Continue Imdur> for now.  Cardiology to evaluate need to continue. Continue aspirin Discontinued Lipitor> with elevated CK, elevated liver enzymes may likely have been secondary to his start of statin 2 weeks prior.  Will trend liver enzymes today and start work-up for other causes of elevated liver enzymes. We will recheck CK> possibly elevated secondary to mild rhabdo from statin start, exercising start or combination of both. - Ambulatory referral to Cardiology   Elevated LFTs/elevated CK/AKI -Discussed importance of hydration, especially with exercise. Discontinued Lipitor> with elevated CK, elevated liver enzymes may likely have been secondary to his start of statin 2 weeks prior.  Will trend liver enzymes today and start work-up for other causes of elevated liver enzymes. We will recheck CK> possibly elevated secondary to mild rhabdo from statin start, exercising start or combination of both. Mild AKI in hospital> improved at discharge with fluids.  Possibly secondary to mild rhabdo from either exercise or starting statin.   - Comp Met (CMET) - Hepatitis, Acute -  HIV antibody (with reflex) - CK (Creatine Kinase) -If remains elevated on  follow-up in 4 weeks for recheck again and consider autoimmune work-up and ultrasound.  Atypical chest pain Current chest discomfort is consistent with costochondritis and reproducible with palpation. Patient has been pushing himself a great deal with over the last 2 weeks to get in shape for his health.  Suspect his regimen may have caused  Rhabdomyolysis.  -Discussed importance of hydration.   We discussed referral to cardiology given his negative work-up.  He is in agreement to establish with cardiology and also may need to consider starting PK S9 inhibitor in place of statin  - Ambulatory referral to Cardiology  Right ear pain/cerumen debris on tympanic membrane-right: Discussed avoidance of  using Q-tips. Debrox solution prescribed with instructions.   Reviewed expectations re: course of current medical issues.  Discussed self-management of symptoms.  Outlined signs and symptoms indicating need for more acute intervention.  Patient verbalized understanding and all questions were answered.  Patient received an After-Visit Summary.  Any changes in medications were reviewed and patient was provided with updated med list with their AVS.     Follow-up in 4 weeks to recheck liver enzymes and myalgias. Chronic conditions follow-up every 5-1/2 months.  Orders Placed This Encounter  Procedures  . Comp Met (CMET)  . Hepatitis, Acute  . HIV antibody (with reflex)  . CK (Creatine Kinase)  . Ambulatory referral to Cardiology   Meds ordered this encounter  Medications  . carbamide peroxide (DEBROX) 6.5 % OTIC solution    Sig: Place 5 drops into the right ear 2 (two) times daily.    Dispense:  15 mL    Refill:  0  . isosorbide mononitrate (IMDUR) 30 MG 24 hr tablet    Sig: Take 1 tablet (30 mg total) by mouth daily.    Dispense:  90 tablet    Refill:  1  . lisinopril-hydrochlorothiazide (ZESTORETIC)  20-25 MG tablet    Sig: Take 1 tablet by mouth daily.    Dispense:  90 tablet    Refill:  0  . metoprolol tartrate (LOPRESSOR) 25 MG tablet    Sig: Take 1 tablet (25 mg total) by mouth 2 (two) times daily.    Dispense:  180 tablet    Refill:  0    Referral Orders     Ambulatory referral to Cardiology  > than  40 minutes spent with patient.  Note is dictated utilizing voice recognition software. Although note has been proof read prior to signing, occasional typographical errors still can be missed. If any questions arise, please do not hesitate to call for verification.   electronically signed by:  Howard Pouch, DO  [place]

## 2019-12-02 ENCOUNTER — Encounter: Payer: Self-pay | Admitting: Family Medicine

## 2019-12-02 ENCOUNTER — Telehealth: Payer: Self-pay | Admitting: Family Medicine

## 2019-12-02 DIAGNOSIS — R7989 Other specified abnormal findings of blood chemistry: Secondary | ICD-10-CM | POA: Insufficient documentation

## 2019-12-02 DIAGNOSIS — R748 Abnormal levels of other serum enzymes: Secondary | ICD-10-CM

## 2019-12-02 DIAGNOSIS — E782 Mixed hyperlipidemia: Secondary | ICD-10-CM

## 2019-12-02 DIAGNOSIS — H6121 Impacted cerumen, right ear: Secondary | ICD-10-CM | POA: Insufficient documentation

## 2019-12-02 DIAGNOSIS — N179 Acute kidney failure, unspecified: Secondary | ICD-10-CM | POA: Insufficient documentation

## 2019-12-02 HISTORY — DX: Abnormal levels of other serum enzymes: R74.8

## 2019-12-02 HISTORY — DX: Mixed hyperlipidemia: E78.2

## 2019-12-02 HISTORY — DX: Other specified abnormal findings of blood chemistry: R79.89

## 2019-12-02 LAB — HEPATITIS PANEL, ACUTE
Hep A IgM: NONREACTIVE
Hep B C IgM: NONREACTIVE
Hepatitis B Surface Ag: NONREACTIVE
Hepatitis C Ab: NONREACTIVE
SIGNAL TO CUT-OFF: 0.04 (ref <1.00)

## 2019-12-02 LAB — HIV ANTIBODY (ROUTINE TESTING W REFLEX): HIV 1&2 Ab, 4th Generation: NONREACTIVE

## 2019-12-02 NOTE — Telephone Encounter (Signed)
Please inform patient his hepatitis panel/HIV work-up that was completed for his elevated liver enzymes were both normal/negative. The CK enzyme that is released from muscle is back to normal. His liver enzymes are mildly improved from the day he was discharged from the hospital, but about the same level from the day he was admitted to the hospital.   I do want to see him back in 4 weeks to continue to trend the liver enzymes .  If they remain elevated we will need to do further studies at that time.  Please schedule him for a provider/office visit with a lab appointment 2 to 3 days prior so that we may review the results together and discuss plan.(order has been placed)  I have placed a referral to cardiology he should hear from them to schedule.

## 2019-12-05 NOTE — Telephone Encounter (Signed)
Pt was called and given information. He was scheduled for labs and PCP appt. He verbalized understanding

## 2019-12-07 ENCOUNTER — Ambulatory Visit: Payer: 59 | Admitting: Pulmonary Disease

## 2019-12-07 ENCOUNTER — Other Ambulatory Visit: Payer: Self-pay

## 2019-12-07 ENCOUNTER — Encounter: Payer: Self-pay | Admitting: Pulmonary Disease

## 2019-12-07 VITALS — BP 114/70 | HR 74 | Temp 97.1°F | Ht 69.0 in | Wt 309.4 lb

## 2019-12-07 DIAGNOSIS — G4733 Obstructive sleep apnea (adult) (pediatric): Secondary | ICD-10-CM

## 2019-12-07 NOTE — Patient Instructions (Signed)
High probability of significant obstructive sleep apnea  We will schedule you for home sleep study  I will see you back in about 3 months  Continue with your weight loss efforts   Sleep Apnea Sleep apnea is a condition in which breathing pauses or becomes shallow during sleep. Episodes of sleep apnea usually last 10 seconds or longer, and they may occur as many as 20 times an hour. Sleep apnea disrupts your sleep and keeps your body from getting the rest that it needs. This condition can increase your risk of certain health problems, including:  Heart attack.  Stroke.  Obesity.  Diabetes.  Heart failure.  Irregular heartbeat. What are the causes? There are three kinds of sleep apnea:  Obstructive sleep apnea. This kind is caused by a blocked or collapsed airway.  Central sleep apnea. This kind happens when the part of the brain that controls breathing does not send the correct signals to the muscles that control breathing.  Mixed sleep apnea. This is a combination of obstructive and central sleep apnea. The most common cause of this condition is a collapsed or blocked airway. An airway can collapse or become blocked if:  Your throat muscles are abnormally relaxed.  Your tongue and tonsils are larger than normal.  You are overweight.  Your airway is smaller than normal. What increases the risk? You are more likely to develop this condition if you:  Are overweight.  Smoke.  Have a smaller than normal airway.  Are elderly.  Are male.  Drink alcohol.  Take sedatives or tranquilizers.  Have a family history of sleep apnea. What are the signs or symptoms? Symptoms of this condition include:  Trouble staying asleep.  Daytime sleepiness and tiredness.  Irritability.  Loud snoring.  Morning headaches.  Trouble concentrating.  Forgetfulness.  Decreased interest in sex.  Unexplained sleepiness.  Mood swings.  Personality changes.  Feelings of  depression.  Waking up often during the night to urinate.  Dry mouth.  Sore throat. How is this diagnosed? This condition may be diagnosed with:  A medical history.  A physical exam.  A series of tests that are done while you are sleeping (sleep study). These tests are usually done in a sleep lab, but they may also be done at home. How is this treated? Treatment for this condition aims to restore normal breathing and to ease symptoms during sleep. It may involve managing health issues that can affect breathing, such as high blood pressure or obesity. Treatment may include:  Sleeping on your side.  Using a decongestant if you have nasal congestion.  Avoiding the use of depressants, including alcohol, sedatives, and narcotics.  Losing weight if you are overweight.  Making changes to your diet.  Quitting smoking.  Using a device to open your airway while you sleep, such as: ? An oral appliance. This is a custom-made mouthpiece that shifts your lower jaw forward. ? A continuous positive airway pressure (CPAP) device. This device blows air through a mask when you breathe out (exhale). ? A nasal expiratory positive airway pressure (EPAP) device. This device has valves that you put into each nostril. ? A bi-level positive airway pressure (BPAP) device. This device blows air through a mask when you breathe in (inhale) and breathe out (exhale).  Having surgery if other treatments do not work. During surgery, excess tissue is removed to create a wider airway. It is important to get treatment for sleep apnea. Without treatment, this condition can lead to:  High blood pressure.  Coronary artery disease.  In men, an inability to achieve or maintain an erection (impotence).  Reduced thinking abilities. Follow these instructions at home: Lifestyle  Make any lifestyle changes that your health care provider recommends.  Eat a healthy, well-balanced diet.  Take steps to lose weight  if you are overweight.  Avoid using depressants, including alcohol, sedatives, and narcotics.  Do not use any products that contain nicotine or tobacco, such as cigarettes, e-cigarettes, and chewing tobacco. If you need help quitting, ask your health care provider. General instructions  Take over-the-counter and prescription medicines only as told by your health care provider.  If you were given a device to open your airway while you sleep, use it only as told by your health care provider.  If you are having surgery, make sure to tell your health care provider you have sleep apnea. You may need to bring your device with you.  Keep all follow-up visits as told by your health care provider. This is important. Contact a health care provider if:  The device that you received to open your airway during sleep is uncomfortable or does not seem to be working.  Your symptoms do not improve.  Your symptoms get worse. Get help right away if:  You develop: ? Chest pain. ? Shortness of breath. ? Discomfort in your back, arms, or stomach.  You have: ? Trouble speaking. ? Weakness on one side of your body. ? Drooping in your face. These symptoms may represent a serious problem that is an emergency. Do not wait to see if the symptoms will go away. Get medical help right away. Call your local emergency services (911 in the [place] Do not drive yourself to the hospital. Summary  Sleep apnea is a condition in which breathing pauses or becomes shallow during sleep.  The most common cause is a collapsed or blocked airway.  The goal of treatment is to restore normal breathing and to ease symptoms during sleep. This information is not intended to replace advice given to you by your health care provider. Make sure you discuss any questions you have with your health care provider. Document Revised: 03/23/2019 Document Reviewed: 06/01/2018 Elsevier Patient Education  [place].

## 2019-12-07 NOTE — Progress Notes (Signed)
Subjective:    Patient ID: Christian Escobar, male    DOB: [birth year], 44 y.o.   MRN: [id number]  Patient being seen for witnessed apneas, loud snoring, choking respirations at night  Concern for obstructive sleep apnea Has managed to lose about 30 pounds recently Significant weight gain from about age 87 Weight about 180 when he was [age]  Has history of high blood pressure, asthma, hypercholesterolemia  Used to have a lot of headaches in the morning but recently not as much since he lost about 30 pounds, his snoring also is said to be less severe since is lost a little bit of weight  Usually goes to bed between 8 and 10 PM, takes him about 30 minutes to fall asleep Wakes up about 2-3 times during the night Final awakening time about 6 AM  Admits to dryness of his mouth in the mornings Admits to gasping respirations at night His memory is good Denies sleepy driving  Dad did have obstructive sleep apnea, was able to discontinue CPAP use with weight loss  Past Medical History:  Diagnosis Date  . Allergy   . Asthma   . COVID-19 09/2019  . Hypertension    Social History   Socioeconomic History  . Marital status: Married    Spouse name: Natalia Leatherwood  . Number of children: 0  . Years of education: 77  . Highest education level: Not on file  Occupational History  . Occupation: Art gallery manager  Tobacco Use  . Smoking status: Never Smoker  . Smokeless tobacco: Never Used  Substance and Sexual Activity  . Alcohol use: No  . Drug use: No  . Sexual activity: Yes    Partners: Female    Comment: married  Other Topics Concern  . Not on file  Social History Narrative   Married to [place]. No children.    BS. Div. Chief Financial Officer.    Drinks caffeine.    Wears seatbelt, bicycle helmet. Smoke detector in the home.    Exercises 3x a week.    Firearms locked in the home.    Feels safe in his relationships.    Social Determinants of Health   Financial Resource Strain:   .  Difficulty of Paying Living Expenses: Not on file  Food Insecurity:   . Worried About Programme researcher, broadcasting/film/video in the Last Year: Not on file  . Ran Out of Food in the Last Year: Not on file  Transportation Needs:   . Lack of Transportation (Medical): Not on file  . Lack of Transportation (Non-Medical): Not on file  Physical Activity:   . Days of Exercise per Week: Not on file  . Minutes of Exercise per Session: Not on file  Stress:   . Feeling of Stress : Not on file  Social Connections:   . Frequency of Communication with Friends and Family: Not on file  . Frequency of Social Gatherings with Friends and Family: Not on file  . Attends Religious Services: Not on file  . Active Member of Clubs or Organizations: Not on file  . Attends Banker Meetings: Not on file  . Marital Status: Not on file  Intimate Partner Violence:   . Fear of Current or Ex-Partner: Not on file  . Emotionally Abused: Not on file  . Physically Abused: Not on file  . Sexually Abused: Not on file   Family History  Problem Relation Age of Onset  . Hypertension Father   . Arthritis Father    Review  of Systems  Constitutional: Positive for unexpected weight change.  Cardiovascular: Positive for chest pain.  Psychiatric/Behavioral: Positive for sleep disturbance.  All other systems reviewed and are negative.     Objective:   Physical Exam Constitutional:      Appearance: He is obese.  HENT:     Head: Normocephalic.     Nose: Nose normal. No congestion.     Mouth/Throat:     Mouth: Mucous membranes are moist.     Comments: Crowded, Mallampati 2 Eyes:     Pupils: Pupils are equal, round, and reactive to light.  Neck:     Comments: Neck size 18 Cardiovascular:     Pulses: Normal pulses.     Heart sounds: Normal heart sounds. No murmur.  Pulmonary:     Effort: Pulmonary effort is normal. No respiratory distress.     Breath sounds: Normal breath sounds. No stridor. No wheezing or rhonchi.    Musculoskeletal:        General: Normal range of motion.     Cervical back: Normal range of motion. No rigidity.  Skin:    General: Skin is warm.  Neurological:     General: No focal deficit present.     Mental Status: He is alert.    Vitals:   12/07/19 0931  BP: 114/70  Pulse: 74  Temp: (!) 97.1 F (36.2 C)  SpO2: 98%   Results of the Epworth flowsheet 12/07/2019  Sitting and reading 2  Watching TV 2  Sitting, inactive in a public place (e.g. a theatre or a meeting) 0  As a passenger in a car for an hour without a break 0  Lying down to rest in the afternoon when circumstances permit 2  Sitting and talking to someone 0  Sitting quietly after a lunch without alcohol 1  In a car, while stopped for a few minutes in traffic 0  Total score 7      Assessment & Plan:  High probability of significant obstructive sleep apnea  Excessive daytime sleepiness  Morbid obesity  Pathophysiology of sleep disordered breathing discussed with the patient Treatment options for sleep disordered breathing discussed with patient  Plan: Schedule the patient for home sleep study  Encouraged to continue working on weight loss efforts  I will see him back in about 3 months  Encouraged to call with any significant concerns

## 2019-12-12 ENCOUNTER — Ambulatory Visit (INDEPENDENT_AMBULATORY_CARE_PROVIDER_SITE_OTHER): Payer: 59 | Admitting: Cardiology

## 2019-12-12 ENCOUNTER — Encounter: Payer: Self-pay | Admitting: Cardiology

## 2019-12-12 ENCOUNTER — Other Ambulatory Visit: Payer: Self-pay

## 2019-12-12 VITALS — BP 112/74 | HR 71 | Ht 69.0 in | Wt 306.0 lb

## 2019-12-12 DIAGNOSIS — R0789 Other chest pain: Secondary | ICD-10-CM | POA: Diagnosis not present

## 2019-12-12 DIAGNOSIS — I10 Essential (primary) hypertension: Secondary | ICD-10-CM

## 2019-12-12 DIAGNOSIS — R7303 Prediabetes: Secondary | ICD-10-CM

## 2019-12-12 DIAGNOSIS — R0683 Snoring: Secondary | ICD-10-CM | POA: Diagnosis not present

## 2019-12-12 DIAGNOSIS — E782 Mixed hyperlipidemia: Secondary | ICD-10-CM

## 2019-12-12 HISTORY — DX: Other chest pain: R07.89

## 2019-12-12 NOTE — Progress Notes (Signed)
Cardiology Consultation:    Date:  12/12/2019   ID:  Christian Escobar, DOB January 08, 1976, MRN [id number]  PCP:  Natalia Leatherwood, DO  Cardiologist:  Gypsy Balsam, MD   Referring MD: Natalia Leatherwood, DO   No chief complaint on file. A lot of chest pain  History of Present Illness:    Christian Escobar is a 44 y.o. male who is being seen today for the evaluation of chest pain at the request of Kuneff, Renee A, DO.  Around Thanksgiving he ended up having coronavirus infection.  Luckily he had only very mild course but it scares him so much that he decided to change his life.  He changed his diet he used Mediterranean diet, he lost about 25 pounds and after that he try to add some exercises to it.  So he started walking on the regular basis and then he started complaining of having some chest pain interestingly pain is reproducible by pressing chest wall, pain is worse with twisting his body as well as taking deep breath.  He end up going to the hospital because of this pain.  Quiet extensive evaluation has been done which include stress test which was negative.  He comes today to my office to talk about that.  Again the way that he described pain look very atypical for heart worse with pressing the chest wall worse with twisting his body worse with moving his shoulder.  He said this sensation was there for about 3 days and now is gone.  He is afraid to exercise because of the sensation.  Described to have some exertional shortness of breath but no palpitations. Past medical history significant for hypertension, probably obstructive sleep apnea, he is scheduled to have sleep study done, morbid obesity, dyslipidemia with intolerance to statin because of elevation of liver function test, borderline diabetes. Does not have family history of premature coronary artery disease. He never smoked. Does not drink alcohol  Past Medical History:  Diagnosis Date  . Allergy   . Asthma   . COVID-19 09/2019  .  Hypertension     Past Surgical History:  Procedure Laterality Date  . NO PAST SURGERIES      Current Medications: Current Meds  Medication Sig  . ASPIRIN ADULT LOW STRENGTH 81 MG EC tablet Take 81 mg by mouth daily.  . isosorbide mononitrate (IMDUR) 30 MG 24 hr tablet Take 1 tablet (30 mg total) by mouth daily.  Marland Kitchen lisinopril-hydrochlorothiazide (ZESTORETIC) 20-25 MG tablet Take 1 tablet by mouth daily.  . metoprolol tartrate (LOPRESSOR) 25 MG tablet Take 1 tablet (25 mg total) by mouth 2 (two) times daily.     Allergies:   Lipitor [atorvastatin]   Social History   Socioeconomic History  . Marital status: Married    Spouse name: Natalia Leatherwood  . Number of children: 0  . Years of education: 73  . Highest education level: Not on file  Occupational History  . Occupation: Art gallery manager  Tobacco Use  . Smoking status: Never Smoker  . Smokeless tobacco: Never Used  Substance and Sexual Activity  . Alcohol use: No  . Drug use: No  . Sexual activity: Yes    Partners: Female    Comment: married  Other Topics Concern  . Not on file  Social History Narrative   Married to [place]. No children.    BS. Div. Chief Financial Officer.    Drinks caffeine.    Wears seatbelt, bicycle helmet. Smoke detector in the home.  Exercises 3x a week.    Firearms locked in the home.    Feels safe in his relationships.    Social Determinants of Health   Financial Resource Strain:   . Difficulty of Paying Living Expenses: Not on file  Food Insecurity:   . Worried About Charity fundraiser in the Last Year: Not on file  . Ran Out of Food in the Last Year: Not on file  Transportation Needs:   . Lack of Transportation (Medical): Not on file  . Lack of Transportation (Non-Medical): Not on file  Physical Activity:   . Days of Exercise per Week: Not on file  . Minutes of Exercise per Session: Not on file  Stress:   . Feeling of Stress : Not on file  Social Connections:   . Frequency of Communication  with Friends and Family: Not on file  . Frequency of Social Gatherings with Friends and Family: Not on file  . Attends Religious Services: Not on file  . Active Member of Clubs or Organizations: Not on file  . Attends Archivist Meetings: Not on file  . Marital Status: Not on file     Family History: The patient's family history includes Arthritis in his father; Hypertension in his father. ROS:   Please see the history of present illness.    All 14 point review of systems negative except as described per history of present illness.  EKGs/Labs/Other Studies Reviewed:    The following studies were reviewed today: I did review quite extensive records from [facility].  Cardiac enzymes negative, stress test negative.  EKG:  EKG is  ordered today.  The ekg ordered today demonstrates normal sinus rhythm, normal P interval, normal QS complex duration morphology.  Recent Labs: 11/18/2019: Hemoglobin 15.5; Platelets 294.0; TSH 2.52 12/01/2019: ALT 103; BUN 15; Creatinine, Ser 1.22; Potassium 4.1; Sodium 136  Recent Lipid Panel    Component Value Date/Time   CHOL 244 (H) 11/18/2019 0839   TRIG 143.0 11/18/2019 0839   HDL 29.70 (L) 11/18/2019 0839   CHOLHDL 8 11/18/2019 0839   VLDL 28.6 11/18/2019 0839   LDLCALC 186 (H) 11/18/2019 0839    Physical Exam:    VS:  BP 112/74   Pulse 71   Ht 5\' 9"  (1.753 m)   Wt (!) 306 lb (138.8 kg)   SpO2 97%   BMI 45.19 kg/m     Wt Readings from Last 3 Encounters:  12/12/19 (!) 306 lb (138.8 kg)  12/07/19 (!) 309 lb 6.4 oz (140.3 kg)  12/01/19 (!) 308 lb 6 oz (139.9 kg)     GEN:  Well nourished, well developed in no acute distress HEENT: Normal NECK: No JVD; No carotid bruits LYMPHATICS: No lymphadenopathy CARDIAC: RRR, no murmurs, no rubs, no gallops RESPIRATORY:  Clear to auscultation without rales, wheezing or rhonchi  ABDOMEN: Soft, non-tender, non-distended MUSCULOSKELETAL:  No edema; No deformity  SKIN: Warm and  dry NEUROLOGIC:  Alert and oriented x 3 PSYCHIATRIC:  Normal affect   ASSESSMENT:    1. Essential hypertension, benign   2. Atypical chest pain   3. Prediabetes   4. Snoring   5. Mixed hyperlipidemia    PLAN:    In order of problems listed above:  1. Atypical chest pain.  Stress test negative.  Pain look very atypical.  I think as a risk assessment will be beneficial to perform calcium score again.  This is important especially in view of the fact that he  does have some difficulty tolerating statin.  Knowing his risks and calcium score will help [place] determine how aggressive we need to be with his cholesterol management. 2. Essential hypertension blood pressure well controlled today we will continue present management. 3. Prediabetes we talked about this in length and I strongly recommend him to be active which should help with the muscle buildup which should help with controlling of diabetes. 4. Snoring he scheduled to have a sleep study which I think is excellent idea. 5. Mixed dyslipidemia with intolerance to statin.  Will obtain calcium score which help [place] determine how aggressive we need to be.  We talked in length about healthy lifestyle he is aware the Mediterranean diet and I congratulated him for it and encouraged him to stick with this.  Again will get calcium score which will help [place] to determine his risks, we did talk about exercises on the regular basis which I asked him to start gently and carefully.  We will see again in about 6 weeks or sooner if he got a problem   Medication Adjustments/Labs and Tests Ordered: Current medicines are reviewed at length with the patient today.  Concerns regarding medicines are outlined above.  No orders of the defined types were placed in this encounter.  No orders of the defined types were placed in this encounter.   Signed, Georgeanna Lea, MD, [facility] 12/12/2019 10:32 AM    [facility]

## 2019-12-12 NOTE — Patient Instructions (Signed)
Medication Instructions:  Your physician recommends that you continue on your current medications as directed. Please refer to the Current Medication list given to you today.  *If you need a refill on your cardiac medications before your next appointment, please call your pharmacy*  Lab Work: Your physician recommends that you continue on your current medications as directed. Please refer to the Current Medication list given to you today.  If you have labs (blood work) drawn today and your tests are completely normal, you will receive your results only by: Marland Kitchen MyChart Message (if you have MyChart) OR . A paper copy in the mail If you have any lab test that is abnormal or we need to change your treatment, we will call you to review the results.  Testing/Procedures: None.   Follow-Up: At [facility], you and your health needs are our priority.  As part of our continuing mission to provide you with exceptional heart care, we have created designated Provider Care Teams.  These Care Teams include your primary Cardiologist (physician) and Advanced Practice Providers (APPs -  Physician Assistants and Nurse Practitioners) who all work together to provide you with the care you need, when you need it.  Your next appointment:   6 week(s)  The format for your next appointment:   In Person  Provider:   You may see Dr. Bing Matter or the following Advanced Practice Provider on your designated Care Team:    Gillian Shields, FNP   Other Instructions   Coronary Calcium Scan A coronary calcium scan is an imaging test used to look for deposits of plaque in the inner lining of the blood vessels of the heart (coronary arteries). Plaque is made up of calcium, protein, and fatty substances. These deposits of plaque can partly clog and narrow the coronary arteries without producing any symptoms or warning signs. This puts a person at risk for a heart attack. This test is recommended for people who are at  moderate risk for heart disease. The test can find plaque deposits before symptoms develop. Tell a health care provider about:  Any allergies you have.  All medicines you are taking, including vitamins, herbs, eye drops, creams, and over-the-counter medicines.  Any problems you or family members have had with anesthetic medicines.  Any blood disorders you have.  Any surgeries you have had.  Any medical conditions you have.  Whether you are pregnant or may be pregnant. What are the risks? Generally, this is a safe procedure. However, problems may occur, including:  Harm to a pregnant woman and her unborn baby. This test involves the use of radiation. Radiation exposure can be dangerous to a pregnant woman and her unborn baby. If you are pregnant or think you may be pregnant, you should not have this procedure done.  Slight increase in the risk of cancer. This is because of the radiation involved in the test. What happens before the procedure? Ask your health care provider for any specific instructions on how to prepare for this procedure. You may be asked to avoid products that contain caffeine, tobacco, or nicotine for 4 hours before the procedure. What happens during the procedure?   You will undress and remove any jewelry from your neck or chest.  You will put on a hospital gown.  Sticky electrodes will be placed on your chest. The electrodes will be connected to an electrocardiogram (ECG) machine to record a tracing of the electrical activity of your heart.  You will lie down on a  curved bed that is attached to the [place].  You may be given medicine to slow down your heart rate so that clear pictures can be created.  You will be moved into the CT scanner, and the CT scanner will take pictures of your heart. During this time, you will be asked to lie still and hold your breath for 2-3 seconds at a time while each picture of your heart is being taken. The procedure may vary  among health care providers and hospitals. What happens after the procedure?  You can get dressed.  You can return to your normal activities.  It is up to you to get the results of your procedure. Ask your health care provider, or the department that is doing the procedure, when your results will be ready. Summary  A coronary calcium scan is an imaging test used to look for deposits of plaque in the inner lining of the blood vessels of the heart (coronary arteries). Plaque is made up of calcium, protein, and fatty substances.  Generally, this is a safe procedure. Tell your health care provider if you are pregnant or may be pregnant.  Ask your health care provider for any specific instructions on how to prepare for this procedure.  A CT scanner will take pictures of your heart.  You can return to your normal activities after the scan is done. This information is not intended to replace advice given to you by your health care provider. Make sure you discuss any questions you have with your health care provider. Document Revised: 04/26/2019 Document Reviewed: 04/26/2019 Elsevier Patient Education  [place].

## 2019-12-12 NOTE — Addendum Note (Signed)
Addended by: Lita Mains on: 12/12/2019 10:45 AM   Modules accepted: Orders

## 2019-12-13 ENCOUNTER — Encounter: Payer: Self-pay | Admitting: Family Medicine

## 2019-12-13 NOTE — Telephone Encounter (Signed)
Please advise 

## 2019-12-13 NOTE — Telephone Encounter (Signed)
Unfortunately patient is prescribed a combination Pill of lisinopril-HCTZ. Therefore changing this would mean we would need to split the combination medication. And he would be taking HCTZ and the new medications separately. If this is something he desires to pursue, I can call in HCTZ and the new medication would be called losartan.  There is nothing over-the-counter he can take to stop the dry cough reaction if it is coming from the lisinopril.

## 2019-12-13 NOTE — Telephone Encounter (Signed)
Pt was called and does not want to change medications at this point and does not feel the cough is that bad. He states he has a F/U in March and if he decides he wants to change medications he will talk about it then at appt.

## 2019-12-19 ENCOUNTER — Ambulatory Visit: Payer: 59 | Admitting: Family Medicine

## 2019-12-23 ENCOUNTER — Encounter: Payer: Self-pay | Admitting: Family Medicine

## 2019-12-28 ENCOUNTER — Other Ambulatory Visit: Payer: Self-pay

## 2019-12-28 ENCOUNTER — Ambulatory Visit (INDEPENDENT_AMBULATORY_CARE_PROVIDER_SITE_OTHER): Payer: 59 | Admitting: Family Medicine

## 2019-12-28 DIAGNOSIS — R748 Abnormal levels of other serum enzymes: Secondary | ICD-10-CM

## 2019-12-28 DIAGNOSIS — R7989 Other specified abnormal findings of blood chemistry: Secondary | ICD-10-CM | POA: Diagnosis not present

## 2019-12-28 LAB — COMPREHENSIVE METABOLIC PANEL WITH GFR
ALT: 55 U/L — ABNORMAL HIGH (ref 0–53)
AST: 26 U/L (ref 0–37)
Albumin: 4.4 g/dL (ref 3.5–5.2)
Alkaline Phosphatase: 54 U/L (ref 39–117)
BUN: 18 mg/dL (ref 6–23)
CO2: 31 meq/L (ref 19–32)
Calcium: 9.8 mg/dL (ref 8.4–10.5)
Chloride: 101 meq/L (ref 96–112)
Creatinine, Ser: 1.23 mg/dL (ref 0.40–1.50)
GFR: 64.53 mL/min
Glucose, Bld: 95 mg/dL (ref 70–99)
Potassium: 4 meq/L (ref 3.5–5.1)
Sodium: 138 meq/L (ref 135–145)
Total Bilirubin: 0.7 mg/dL (ref 0.2–1.2)
Total Protein: 7.2 g/dL (ref 6.0–8.3)

## 2019-12-28 LAB — CK: Total CK: 117 U/L (ref 7–232)

## 2019-12-29 ENCOUNTER — Ambulatory Visit (INDEPENDENT_AMBULATORY_CARE_PROVIDER_SITE_OTHER)
Admission: RE | Admit: 2019-12-29 | Discharge: 2019-12-29 | Disposition: A | Discharge status: Home / Self Care | Payer: Self-pay | Source: Ambulatory Visit | Attending: Cardiology | Admitting: Cardiology

## 2019-12-29 DIAGNOSIS — R0789 Other chest pain: Secondary | ICD-10-CM

## 2019-12-30 ENCOUNTER — Ambulatory Visit: Payer: 59 | Admitting: Family Medicine

## 2019-12-30 ENCOUNTER — Other Ambulatory Visit: Payer: Self-pay

## 2019-12-30 ENCOUNTER — Encounter: Payer: Self-pay | Admitting: Family Medicine

## 2019-12-30 VITALS — BP 139/89 | HR 79 | Temp 97.8°F | Resp 18 | Wt 295.0 lb

## 2019-12-30 DIAGNOSIS — R7989 Other specified abnormal findings of blood chemistry: Secondary | ICD-10-CM | POA: Diagnosis not present

## 2019-12-30 DIAGNOSIS — N179 Acute kidney failure, unspecified: Secondary | ICD-10-CM | POA: Diagnosis not present

## 2019-12-30 DIAGNOSIS — R09A2 Foreign body sensation, throat: Secondary | ICD-10-CM

## 2019-12-30 DIAGNOSIS — R0989 Other specified symptoms and signs involving the circulatory and respiratory systems: Secondary | ICD-10-CM

## 2019-12-30 DIAGNOSIS — R748 Abnormal levels of other serum enzymes: Secondary | ICD-10-CM

## 2019-12-30 HISTORY — DX: Other specified symptoms and signs involving the circulatory and respiratory systems: R09.89

## 2019-12-30 HISTORY — DX: Foreign body sensation, throat: R09.A2

## 2019-12-30 MED ORDER — LOSARTAN POTASSIUM 50 MG PO TABS: 50.0000 mg | ORAL_TABLET | Freq: Every day | ORAL | 0 refills | Status: DC

## 2019-12-30 MED ORDER — COLESEVELAM HCL 625 MG PO TABS: 1250.0000 mg | ORAL_TABLET | Freq: Two times a day (BID) | ORAL | 5 refills | Status: DC

## 2019-12-30 MED ORDER — ASPIRIN ADULT LOW STRENGTH 81 MG PO TBEC: 81.0000 mg | DELAYED_RELEASE_TABLET | Freq: Every day | ORAL | 3 refills | Status: DC

## 2019-12-30 MED ORDER — HYDROCHLOROTHIAZIDE 25 MG PO TABS: 25.0000 mg | ORAL_TABLET | Freq: Every day | ORAL | 0 refills | Status: DC

## 2019-12-30 NOTE — Patient Instructions (Signed)
Stop lisinopril combo. Start losartan and HCTZ pills.  Follow up in 3 months with fasting labs.   If cough remains after stopping lisinopril (give it a few weeks) then we would need to consider ENT.   Start welchol 2 tabs twice a day for cholesterol.

## 2019-12-30 NOTE — Progress Notes (Signed)
This visit occurred during the SARS-CoV-2 public health emergency.  Safety protocols were in place, including screening questions prior to the visit, additional usage of staff PPE, and extensive cleaning of exam room while observing appropriate contact time as indicated for disinfecting solutions.    Christian Escobar , 12-28-1975, 44 y.o., male MRN: [id number] Patient Care Team    Relationship Specialty Notifications Start End  Ma Hillock, DO PCP - General Family Medicine  09/01/16   Rhetta Mura., MD Referring Physician Cardiology  12/01/19     Chief Complaint  Patient presents with  . discuss lab results     Subjective:  Christian Escobar  is a 44 y.o. male presents for follow-up after elevated liver enzymes and CK suspected to be from statin start.  Patient had repeat liver enzymes completed.  LFTs have down trended with AST in normal range and ALT 55 (53 is normal).  Patient reports he is feeling well.  He has continued to walk and perform exercise.  He is hydrating.  He does report a consistent tickle cough which he believes is secondary to the lisinopril use.  He also states he has a feeling of something being stuck on the left lower side of his throat.  EKG 11/25/2019 Ventricular Rate 69 Atrial Rate 69 P-R Interval 130 QRS Duration 90 Q-T Interval 420 QTC Calculation(Bazett) 450 Calculated P Axis 3 Calculated R Axis 42 Calculated T Axis 29  Diagnosis Normal sinus rhythm Nonspecific T wave abnormality Abnormal ECG When compared with ECG of 25-Nov-2019 10:31, Sinus rhythm has replaced Junctional rhythm Nonspecific T wave abnormality has replaced inverted T waves in Inferior leads Nonspecific T wave abnormality has replaced inverted T waves in Lateral leads QT has lengthened Christian Escobar, Christian Escobar ([id number]) on 11/24/971 5:32:99 PM certifies that he/she has reviewed the ECG tracing and confirms the independent interpretation is  Diagnostics:  CXR, PA and lateral,  11/25/2019 IMPRESSION: Normal  Echocardiogram, 11/25/2019  Left Ventricle Normal left ventricle size. Wall thickness is normal. EF: 55-60%. Wall motion is within normal limits. Doppler parameters consistent with mild diastolic dysfunction and low to normal LA pressure.  Right Ventricle Right ventricle cavity appears normal. Systolic function is normal.  Left Atrium Left atrium cavity size is normal.  Right Atrium Normal sized right atrium.  Mitral Valve The leaflets are not thickened. There is no mitral regurgitation.  Tricuspid Valve The tricuspid valve was not well visualized. There is no regurgitation. Unable to assess RVSP due to incomplete Doppler signal.  Aortic Valve The leaflets are not thickened. There is no regurgitation or stenosis.  Pulmonic Valve The pulmonic valve was not well visualized.  Ascending Aorta The aorta appears normal in size.  Pericardium There is no pericardial effusion.   NM Stress Test, 11/28/2019  FINDINGS: There is normal physiologic distribution of activity throughout the left ventricular myocardium. No evidence of ischemia. No regional wall motion abnormality. Ejection fraction of 60%.  IMPRESSION: Exam within normal limits   Depression screen [facility] 2/9 11/18/2019 09/29/2016 09/01/2016  Decreased Interest 0 0 0  Down, Depressed, Hopeless 0 0 0  PHQ - 2 Score 0 0 0    Allergies  Allergen Reactions  . Lipitor [Atorvastatin] Other (See Comments)    Elevated LFTs, elevated CK with atorvastatin  . Lisinopril Cough   Social History   Tobacco Use  . Smoking status: Never Smoker  . Smokeless tobacco: Never Used  Substance Use Topics  . Alcohol use: No   Past Medical History:  Diagnosis Date  . Allergy   . Asthma   . COVID-19 09/2019  . Hypertension    Past Surgical History:  Procedure Laterality Date  . NO PAST SURGERIES     Family History  Problem Relation Age of Onset  . Hypertension Father   . Arthritis Father    Allergies as of  12/30/2019      Reactions   Lipitor [atorvastatin] Other (See Comments)   Elevated LFTs, elevated CK with atorvastatin   Lisinopril Cough      Medication List       Accurate as of December 30, 2019 11:32 AM. If you have any questions, ask your nurse or doctor.        STOP taking these medications   lisinopril-hydrochlorothiazide 20-25 MG tablet Commonly known as: ZESTORETIC Stopped by: Felix Pacini, DO     TAKE these medications   Aspirin Adult Low Strength 81 MG EC tablet Generic drug: aspirin Take 1 tablet (81 mg total) by mouth daily.   colesevelam 625 MG tablet Commonly known as: Welchol Take 2 tablets (1,250 mg total) by mouth 2 (two) times daily with a meal. Started by: Felix Pacini, DO   hydrochlorothiazide 25 MG tablet Commonly known as: HYDRODIURIL Take 1 tablet (25 mg total) by mouth daily. Started by: Felix Pacini, DO   isosorbide mononitrate 30 MG 24 hr tablet Commonly known as: IMDUR Take 1 tablet (30 mg total) by mouth daily.   losartan 50 MG tablet Commonly known as: COZAAR Take 1 tablet (50 mg total) by mouth daily. Started by: Felix Pacini, DO   metoprolol tartrate 25 MG tablet Commonly known as: LOPRESSOR Take 1 tablet (25 mg total) by mouth 2 (two) times daily.       All past medical history, surgical history, allergies, family history, immunizations and medications were updated in the EMR today and reviewed under the history and medication portions of their EMR.      ROS: Negative, with the exception of above mentioned in HPI   Objective:  BP 139/89 (BP Location: Left Arm, Patient Position: Sitting, Cuff Size: Large)   Pulse 79   Temp 97.8 F (36.6 C) (Temporal)   Resp 18   Wt 295 lb (133.8 kg)   SpO2 95%   BMI 43.56 kg/m  Body mass index is 43.56 kg/m. Gen: Afebrile. No acute distress.  Nontoxic in presentation, well-developed, well-nourished, obese Caucasian male. HENT: AT. [place].  Throat without erythema or exudates.  Small  posterior pharynx opening. Eyes:Pupils Equal Round Reactive to light, Extraocular movements intact,  Conjunctiva without redness, discharge or icterus. Neck/lymp/endocrine: Supple, no lymphadenopathy, no thyromegaly CV: RRR no murmur, no edema Chest: CTAB, no wheeze or crackles Skin: no rashes, purpura or petechiae.  Neuro: Normal gait. PERLA. EOMi. Alert. Oriented x3 Psych: Normal affect, dress and demeanor. Normal speech. Normal thought content and judgment.   Assessment/Plan: Christian Escobar is a 44 y.o. male present for OV for Hospital discharge follow up Essential hypertension, benign/Severe obesity (BMI >= 40) (HCC)/hyperlipidemia/tachycardia/chest discomfort Stable, however cough is felt to be from lisinopril therefore will discontinue lisinopril Continue metoprolol Start losartan 50 mg daily Start HCTZ 25 mg daily (had been on combo of lisinopril-HCTZ) Continue Imdur> for now.  Cardiology to evaluate need to continue. Continue aspirin Start WelChol 2 tabs twice daily for cholesterol Statin intolerant - Ambulatory referral to Cardiology placed last visit  Elevated LFTs/elevated CK/AKI -Improved.  LFTs trending back down to normal.  CK normal.  Kidney function normal.  Globus sensation: Patient's exam is normal today no lymphadenopathy.  He does have a very small posterior pharynx.  Discussed the possibility of irritation with his chronic cough which is hopefully secondary to lisinopril.  If symptoms do not resolve as lisinopril is removed from his regimen, would advise him to follow-up and consider ENT evaluation for time.   Reviewed expectations re: course of current medical issues.  Discussed self-management of symptoms.  Outlined signs and symptoms indicating need for more acute intervention.  Patient verbalized understanding and all questions were answered.  Patient received an After-Visit Summary.  Any changes in medications were reviewed and patient was provided with  updated med list with their AVS.     Follow-up in 3 months with repeat cholesterol check and chronic medical condition follow-up  No orders of the defined types were placed in this encounter.  Meds ordered this encounter  Medications  . colesevelam (WELCHOL) 625 MG tablet    Sig: Take 2 tablets (1,250 mg total) by mouth 2 (two) times daily with a meal.    Dispense:  120 tablet    Refill:  5  . ASPIRIN ADULT LOW STRENGTH 81 MG EC tablet    Sig: Take 1 tablet (81 mg total) by mouth daily.    Dispense:  90 tablet    Refill:  3  . losartan (COZAAR) 50 MG tablet    Sig: Take 1 tablet (50 mg total) by mouth daily.    Dispense:  90 tablet    Refill:  0  . hydrochlorothiazide (HYDRODIURIL) 25 MG tablet    Sig: Take 1 tablet (25 mg total) by mouth daily.    Dispense:  90 tablet    Refill:  0   Referral Orders  No referral(s) requested today      Note is dictated utilizing voice recognition software. Although note has been proof read prior to signing, occasional typographical errors still can be missed. If any questions arise, please do not hesitate to call for verification.   electronically signed by:  Felix Pacini, DO  [facility] - [place]

## 2020-01-03 DIAGNOSIS — G4733 Obstructive sleep apnea (adult) (pediatric): Secondary | ICD-10-CM | POA: Diagnosis not present

## 2020-01-05 ENCOUNTER — Ambulatory Visit: Payer: 59

## 2020-01-05 ENCOUNTER — Other Ambulatory Visit: Payer: Self-pay

## 2020-01-05 DIAGNOSIS — G4733 Obstructive sleep apnea (adult) (pediatric): Secondary | ICD-10-CM

## 2020-01-23 ENCOUNTER — Encounter: Payer: Self-pay | Admitting: Family Medicine

## 2020-01-23 ENCOUNTER — Other Ambulatory Visit: Payer: Self-pay | Admitting: Family Medicine

## 2020-01-24 ENCOUNTER — Ambulatory Visit: Payer: 59 | Admitting: Cardiology

## 2020-01-24 ENCOUNTER — Encounter: Payer: Self-pay | Admitting: Family Medicine

## 2020-02-08 DIAGNOSIS — G4733 Obstructive sleep apnea (adult) (pediatric): Secondary | ICD-10-CM

## 2020-02-08 NOTE — Telephone Encounter (Signed)
Dr. Wynona Neat, please advise on HST results.

## 2020-02-10 NOTE — Telephone Encounter (Signed)
Apologize for the patient not receiving a call following the study evaluation   Study did reveal severe obstructive sleep apnea with severe oxygen desaturations   It is likely that he will need oxygen supplementation with the treatment and the recommendation will be to have an in lab titration study performed-payors will not pay for CPAP and then oxygen supplementation if he is not titrated in lab.  Order a titration study to be done in lab

## 2020-02-13 ENCOUNTER — Encounter (HOSPITAL_BASED_OUTPATIENT_CLINIC_OR_DEPARTMENT_OTHER): Payer: Self-pay

## 2020-02-22 ENCOUNTER — Other Ambulatory Visit: Payer: Self-pay

## 2020-02-22 ENCOUNTER — Ambulatory Visit (INDEPENDENT_AMBULATORY_CARE_PROVIDER_SITE_OTHER): Payer: 59 | Admitting: Cardiology

## 2020-02-22 ENCOUNTER — Encounter: Payer: Self-pay | Admitting: Cardiology

## 2020-02-22 VITALS — BP 134/90 | HR 74 | Ht 69.0 in | Wt 304.0 lb

## 2020-02-22 DIAGNOSIS — I10 Essential (primary) hypertension: Secondary | ICD-10-CM

## 2020-02-22 DIAGNOSIS — R0789 Other chest pain: Secondary | ICD-10-CM | POA: Diagnosis not present

## 2020-02-22 DIAGNOSIS — E782 Mixed hyperlipidemia: Secondary | ICD-10-CM | POA: Diagnosis not present

## 2020-02-22 NOTE — Patient Instructions (Signed)

## 2020-02-22 NOTE — Progress Notes (Signed)
Cardiology Office Note:    Date:  02/22/2020   ID:  Christian Escobar, DOB November 28, 1975, MRN [id number]  PCP:  Natalia Leatherwood, DO  Cardiologist:  Gypsy Balsam, MD    Referring MD: Natalia Leatherwood, DO   No chief complaint on file. M doing fine  History of Present Illness:    Christian Escobar is a 44 y.o. male with multiple risk factors for coronary artery disease.  He also got atypical chest pain.  Stress test was negative after he did some risk factor assessment for choice of lipid therapy he had calcium score which came 0.  Also he did change the lifestyle he started better diet also started walking on the regular basis however lately he slacked off and he gained weight weight back.  He is very disappointed with himself.  Also in the meantime he had sleep study done which showed severe sleep apnea.  He is scheduled to have a titration sleep study this coming Friday.  Past Medical History:  Diagnosis Date  . Allergy   . Asthma   . COVID-19 09/2019  . Hypertension     Past Surgical History:  Procedure Laterality Date  . NO PAST SURGERIES      Current Medications: Current Meds  Medication Sig  . ASPIRIN ADULT LOW STRENGTH 81 MG EC tablet Take 1 tablet (81 mg total) by mouth daily.  . colesevelam (WELCHOL) 625 MG tablet Take 2 tablets (1,250 mg total) by mouth 2 (two) times daily with a meal.  . hydrochlorothiazide (HYDRODIURIL) 25 MG tablet Take 1 tablet (25 mg total) by mouth daily.  . isosorbide mononitrate (IMDUR) 30 MG 24 hr tablet Take 1 tablet (30 mg total) by mouth daily.  Marland Kitchen losartan (COZAAR) 50 MG tablet Take 1 tablet (50 mg total) by mouth daily.  . metoprolol tartrate (LOPRESSOR) 25 MG tablet Take 1 tablet (25 mg total) by mouth 2 (two) times daily.     Allergies:   Lipitor [atorvastatin] and Lisinopril   Social History   Socioeconomic History  . Marital status: Married    Spouse name: Natalia Leatherwood  . Number of children: 0  . Years of education: 55  . Highest  education level: Not on file  Occupational History  . Occupation: Art gallery manager  Tobacco Use  . Smoking status: Never Smoker  . Smokeless tobacco: Never Used  Substance and Sexual Activity  . Alcohol use: No  . Drug use: No  . Sexual activity: Yes    Partners: Female    Comment: married  Other Topics Concern  . Not on file  Social History Narrative   Married to [place]. No children.    BS. Div. Chief Financial Officer.    Drinks caffeine.    Wears seatbelt, bicycle helmet. Smoke detector in the home.    Exercises 3x a week.    Firearms locked in the home.    Feels safe in his relationships.    Social Determinants of Health   Financial Resource Strain:   . Difficulty of Paying Living Expenses:   Food Insecurity:   . Worried About Programme researcher, broadcasting/film/video in the Last Year:   . Barista in the Last Year:   Transportation Needs:   . Freight forwarder (Medical):   Marland Kitchen Lack of Transportation (Non-Medical):   Physical Activity:   . Days of Exercise per Week:   . Minutes of Exercise per Session:   Stress:   . Feeling of Stress :   Social  Connections:   . Frequency of Communication with Friends and Family:   . Frequency of Social Gatherings with Friends and Family:   . Attends Religious Services:   . Active Member of Clubs or Organizations:   . Attends Archivist Meetings:   Marland Kitchen Marital Status:      Family History: The patient's family history includes Arthritis in his father; Hypertension in his father. ROS:   Please see the history of present illness.    All 14 point review of systems negative except as described per history of present illness  EKGs/Labs/Other Studies Reviewed:      Recent Labs: 11/18/2019: Hemoglobin 15.5; Platelets 294.0; TSH 2.52 12/28/2019: ALT 55; BUN 18; Creatinine, Ser 1.23; Potassium 4.0; Sodium 138  Recent Lipid Panel    Component Value Date/Time   CHOL 244 (H) 11/18/2019 0839   TRIG 143.0 11/18/2019 0839   HDL 29.70 (L) 11/18/2019  0839   CHOLHDL 8 11/18/2019 0839   VLDL 28.6 11/18/2019 0839   LDLCALC 186 (H) 11/18/2019 0839    Physical Exam:    VS:  BP 134/90   Pulse 74   Ht 5\' 9"  (1.753 m)   Wt (!) 304 lb (137.9 kg)   SpO2 94%   BMI 44.89 kg/m     Wt Readings from Last 3 Encounters:  02/22/20 (!) 304 lb (137.9 kg)  12/30/19 295 lb (133.8 kg)  12/12/19 (!) 306 lb (138.8 kg)     GEN:  Well nourished, well developed in no acute distress HEENT: Normal NECK: No JVD; No carotid bruits LYMPHATICS: No lymphadenopathy CARDIAC: RRR, no murmurs, no rubs, no gallops RESPIRATORY:  Clear to auscultation without rales, wheezing or rhonchi  ABDOMEN: Soft, non-tender, non-distended MUSCULOSKELETAL:  No edema; No deformity  SKIN: Warm and dry LOWER EXTREMITIES: no swelling NEUROLOGIC:  Alert and oriented x 3 PSYCHIATRIC:  Normal affect   ASSESSMENT:    1. Essential hypertension, benign   2. Atypical chest pain   3. Mixed hyperlipidemia    PLAN:    In order of problems listed above:  1. Essential hypertension blood pressure still above the higher side but hopefully he will change his diet exercise habit manage sleep apnea that will make his blood pressure better. 2. Atypical chest pain calcium score 0 he denies have any pain, stress test negative. 3. Mixed dyslipidemia: I calculated his risk for cardiovascular event within the next 10 years and it can 7.5.  This is intermediate category.  He can consider a statin, however taking in consideration of fibroids calcium score is 0 there is no need to do that.  I told him that because of his borderline diabetes ideally he need to be taking statin but he promised me to exercise lose weight and hopefully will avoid development of full-blown diabetes and I will use this as a recommendation to push more towards exercises.  We created a plan for him to weight less than 280 pounds 4. When I see him which will be in 5 months.   Medication Adjustments/Labs and Tests  Ordered: Current medicines are reviewed at length with the patient today.  Concerns regarding medicines are outlined above.  No orders of the defined types were placed in this encounter.  Medication changes: No orders of the defined types were placed in this encounter.   Signed, Park Liter, MD, [facility] 02/22/2020 4:22 PM    [place]

## 2020-02-24 ENCOUNTER — Other Ambulatory Visit (HOSPITAL_COMMUNITY)
Admission: RE | Admit: 2020-02-24 | Discharge: 2020-02-24 | Disposition: A | Discharge status: Home / Self Care | Payer: 59 | Source: Ambulatory Visit | Attending: Pulmonary Disease | Admitting: Pulmonary Disease

## 2020-02-24 DIAGNOSIS — Z01812 Encounter for preprocedural laboratory examination: Secondary | ICD-10-CM | POA: Insufficient documentation

## 2020-02-24 DIAGNOSIS — Z20822 Contact with and (suspected) exposure to covid-19: Secondary | ICD-10-CM | POA: Diagnosis not present

## 2020-02-25 LAB — SARS CORONAVIRUS 2 (TAT 6-24 HRS): SARS Coronavirus 2: NEGATIVE

## 2020-02-26 ENCOUNTER — Ambulatory Visit (HOSPITAL_BASED_OUTPATIENT_CLINIC_OR_DEPARTMENT_OTHER): Payer: 59 | Attending: Pulmonary Disease | Admitting: Pulmonary Disease

## 2020-02-26 ENCOUNTER — Other Ambulatory Visit: Payer: Self-pay

## 2020-02-26 DIAGNOSIS — I10 Essential (primary) hypertension: Secondary | ICD-10-CM | POA: Diagnosis not present

## 2020-02-26 DIAGNOSIS — G4733 Obstructive sleep apnea (adult) (pediatric): Secondary | ICD-10-CM

## 2020-02-28 ENCOUNTER — Encounter: Payer: Self-pay | Admitting: Family Medicine

## 2020-02-28 ENCOUNTER — Telehealth: Payer: Self-pay | Admitting: Pulmonary Disease

## 2020-02-28 DIAGNOSIS — G4733 Obstructive sleep apnea (adult) (pediatric): Secondary | ICD-10-CM

## 2020-02-28 NOTE — Telephone Encounter (Signed)
Call patient  Sleep study result  Date of study: 02/26/2020   Impression: Obstructive sleep apnea adequately treated with CPAP therapy  Recommendation: Trial of CPAP therapy on 17 cm H2O with EPR 2, a Medium size Fisher&Paykel Full Face Mask Simplus mask and heated humidification.  Encourage weight loss efforts  Office follow-up 4 to 6 weeks following initiation of treatment

## 2020-02-28 NOTE — Procedures (Signed)
POLYSOMNOGRAPHY  Last, First: Osborne, Serio MRN: [id number] Gender: Male Age (years): 44 Weight (lbs): 300 DOB: July 08, 1976 BMI: 44 Primary Care: No PCP Epworth Score: 7 Referring: Tomma Lightning MD Technician: Rosette Reveal Interpreting: Tomma Lightning MD Study Type: CPAP Ordered Study Type: CPAP Study date: 02/26/2020 Location:  CLINICAL INFORMATION Samiel Peel is a 44 year old Male and was referred to the sleep center for evaluation of Obstructive Sleep Apnea (327.23). Indications include Hypertension (401.9).  MEDICATIONS Patient self administered medications include: N/A. Medications administered during study include No sleep medicine administered.  SLEEP STUDY TECHNIQUE The patient underwent an attended overnight polysomnography titration to assess the effects of CPAP therapy. The following variables were monitored: EEG(C4-A1, C3-A2, O1-A2, O2-A1), EOG, submental and leg EMG, ECG, oxyhemoglobin saturation by pulse oximetry, thoracic and abdominal respiratory effort belts, nasal/oral airflow by pressure sensor, body position sensor and snoring sensor. CPAP pressure was titrated to eliminate apneas, hypopneas and oxygen desaturation. Hypopneas were scored per AASM definition IB (4% desaturation)  TECHNICIAN COMMENTS Comments added by Technician: None Comments added by Scorer: N/A SLEEP ARCHITECTURE The study was initiated at 9:38:37 PM and terminated at 4:16:48 AM. Total recorded time was 398.2 minutes. EEG confirmed total sleep time was 325 minutes yielding a sleep efficiency of 81.6%%. Sleep onset after lights out was 25.9 minutes with a REM latency of 42.5 minutes. The patient spent 3.1%% of the night in stage N1 sleep, 53.1%% in stage N2 sleep, 0.0%% in stage N3 and 43.9% in REM. The Arousal Index was 3.5/hour. RESPIRATORY PARAMETERS The overall AHI was 3.1 per hour, and the RDI was 3.5 events/hour with a central apnea index of 1.3per hour. The most  appropriate setting of CPAP was 17 cm H2O. At this setting, the sleep efficiency was 76% and the patient was supine for 94%. The AHI was 1.4 events per hour, and the RDI was 1.4 events/hour (with 1.3 central events) and the arousal index was 1.0 per hour.The oxygen nadir was 90.0% during sleep.    The cumulative time under 88% oxygen saturation was 5.5 minutes  LEG MOVEMENT DATA The total leg movements were 0 with a resulting leg movement index of 0.0/hr. Associated arousal with leg movement index was 0.0/hr. CARDIAC DATA The underlying cardiac rhythm was most consistent with sinus rhythm. Mean heart rate during sleep was 62.4 bpm. Additional rhythm abnormalities include None.   IMPRESSIONS - EKG showed no cardiac abnormalities. - The patient snored with moderate snoring volume. - Moderate Oxygen Desaturation - No Significant Obstructive Sleep apnea(OSA) Optimal pressure attained. - No Significant Central Sleep Apnea (CSA) - No Significant Upper Airway Resistance Syndrome(UARS). - No significant periodic leg movements(PLMs) during sleep, no significant associated arousals. - Reduced sleep efficiency, long primary sleep latency, short REM sleep latency and no slow wave latency.   DIAGNOSIS - Obstructive Sleep Apnea (327.23 [G47.33 ICD-10])  RECOMMENDATIONS - Trial of CPAP therapy on 17 cm H2O with EPR 2, a Medium size Fisher&Paykel Full Face Mask Simplus mask and heated humidification. - Avoid alcohol, sedatives and other CNS depressants that may worsen sleep apnea and disrupt normal sleep architecture. - Sleep hygiene should be reviewed to assess factors that may improve sleep quality. - Weight management and regular exercise should be initiated or continued. - Return to Sleep Center for re-evaluation after 4 weeks of therapy  [Electronically signed] 02/28/2020 06:31 AM  Virl Diamond MD NPI: [id number]

## 2020-02-28 NOTE — Addendum Note (Signed)
Addended by: Janean Sark on: 02/28/2020 11:16 AM   Modules accepted: Orders

## 2020-02-28 NOTE — Telephone Encounter (Signed)
Patient aware of results of sleep study and agreed to therapy. Orders placed for cpap therapy. F/U has been scheduled Nothing further needed.

## 2020-03-20 ENCOUNTER — Other Ambulatory Visit: Payer: Self-pay

## 2020-03-20 MED ORDER — LOSARTAN POTASSIUM 50 MG PO TABS: 50.0000 mg | ORAL_TABLET | Freq: Every day | ORAL | 0 refills | Status: DC

## 2020-04-01 ENCOUNTER — Other Ambulatory Visit: Payer: Self-pay | Admitting: Family Medicine

## 2020-04-02 NOTE — Telephone Encounter (Signed)
Patient knows he was to follow up in June but he is traveling this month w/ work and isn't able to get in until July.  RX sent # [phone], no refills.  Patient understand that this is a one time 30 day supply and he must follow up for refills.

## 2020-04-02 NOTE — Telephone Encounter (Signed)
DME contacted with new number, CPAP order to be filled by [facility]. Patient updated by phone. DME had an incorrect phone number.

## 2020-04-05 ENCOUNTER — Ambulatory Visit: Payer: 59 | Admitting: Pulmonary Disease

## 2020-05-04 ENCOUNTER — Ambulatory Visit: Payer: 59 | Admitting: Family Medicine

## 2020-05-08 ENCOUNTER — Encounter: Payer: Self-pay | Admitting: Family Medicine

## 2020-05-08 ENCOUNTER — Ambulatory Visit: Payer: 59 | Admitting: Family Medicine

## 2020-05-08 ENCOUNTER — Other Ambulatory Visit: Payer: Self-pay

## 2020-05-08 VITALS — BP 134/87 | HR 69 | Temp 98.2°F | Ht 69.0 in | Wt 326.0 lb

## 2020-05-08 DIAGNOSIS — G4733 Obstructive sleep apnea (adult) (pediatric): Secondary | ICD-10-CM

## 2020-05-08 DIAGNOSIS — R000 Tachycardia, unspecified: Secondary | ICD-10-CM

## 2020-05-08 DIAGNOSIS — R7989 Other specified abnormal findings of blood chemistry: Secondary | ICD-10-CM

## 2020-05-08 DIAGNOSIS — E782 Mixed hyperlipidemia: Secondary | ICD-10-CM

## 2020-05-08 DIAGNOSIS — I10 Essential (primary) hypertension: Secondary | ICD-10-CM

## 2020-05-08 DIAGNOSIS — Z9989 Dependence on other enabling machines and devices: Secondary | ICD-10-CM

## 2020-05-08 LAB — LIPID PANEL
Cholesterol: 172 mg/dL (ref 0–200)
HDL: 35.1 mg/dL — ABNORMAL LOW (ref >39.00)
LDL Cholesterol: 114 mg/dL — ABNORMAL HIGH (ref 0–99)
NonHDL: 137.3
Total CHOL/HDL Ratio: 5
Triglycerides: 119 mg/dL (ref 0.0–149.0)
VLDL: 23.8 mg/dL (ref 0.0–40.0)

## 2020-05-08 LAB — HEPATIC FUNCTION PANEL
ALT: 37 U/L (ref 0–53)
AST: 19 U/L (ref 0–37)
Albumin: 4.1 g/dL (ref 3.5–5.2)
Alkaline Phosphatase: 46 U/L (ref 39–117)
Bilirubin, Direct: 0.1 mg/dL (ref 0.0–0.3)
Total Bilirubin: 0.4 mg/dL (ref 0.2–1.2)
Total Protein: 6.5 g/dL (ref 6.0–8.3)

## 2020-05-08 MED ORDER — HYDROCHLOROTHIAZIDE 25 MG PO TABS: 25.0000 mg | ORAL_TABLET | Freq: Every day | ORAL | 1 refills | Status: DC

## 2020-05-08 MED ORDER — COLESEVELAM HCL 625 MG PO TABS: 1250.0000 mg | ORAL_TABLET | Freq: Two times a day (BID) | ORAL | 5 refills | Status: DC

## 2020-05-08 MED ORDER — METOPROLOL TARTRATE 25 MG PO TABS: 25.0000 mg | ORAL_TABLET | Freq: Two times a day (BID) | ORAL | 1 refills | Status: DC

## 2020-05-08 MED ORDER — LOSARTAN POTASSIUM 50 MG PO TABS: 50.0000 mg | ORAL_TABLET | Freq: Every day | ORAL | 1 refills | Status: DC

## 2020-05-08 MED ORDER — ISOSORBIDE MONONITRATE ER 30 MG PO TB24: 30.0000 mg | ORAL_TABLET | Freq: Every day | ORAL | 1 refills | Status: DC

## 2020-05-08 NOTE — Progress Notes (Signed)
This visit occurred during the SARS-CoV-2 public health emergency.  Safety protocols were in place, including screening questions prior to the visit, additional usage of staff PPE, and extensive cleaning of exam room while observing appropriate contact time as indicated for disinfecting solutions.    Christian Escobar , 1976/02/01, 44 y.o., male MRN: [id number] Patient Care Team    Relationship Specialty Notifications Start End  Christian Leatherwood, DO PCP - General Family Medicine  09/01/16   Christian Fus., MD Referring Physician Cardiology  12/01/19     Chief Complaint  Patient presents with  . Hypertension    home readings have been in 120's over average 75  . Sleep Apnea    started on C pap 2 weeks ago has had improvement      Subjective:  Christian Escobar  is a 44 y.o. male presents for follow-up  Hypertension/obesity/hyperlipidemia: Pt reports compliance with metoprolol 25 mg twice daily, HCTZ 25 mg daily, Imdur 30 mg daily and Cozaar 50 mg daily. Blood pressures ranges at home than normal limits. Patient denies chest pain, shortness of breath or lower extremity edema. Pt takes a daily baby ASA.  Patient is statin intolerant-he has been switched over to [name] and due for labs today.  Sleep apnea: He has started his CPAP 2 weeks ago and reports he is feeling improved.  EKG 11/25/2019 Ventricular Rate 69 Atrial Rate 69 P-R Interval 130 QRS Duration 90 Q-T Interval 420 QTC Calculation(Bazett) 450 Calculated P Axis 3 Calculated R Axis 42 Calculated T Axis 29  Diagnosis Normal sinus rhythm Nonspecific T wave abnormality Abnormal ECG When compared with ECG of 25-Nov-2019 10:31, Sinus rhythm has replaced Junctional rhythm Nonspecific T wave abnormality has replaced inverted T waves in Inferior leads Nonspecific T wave abnormality has replaced inverted T waves in Lateral leads QT has lengthened Christian Escobar, Christian Escobar ([id number]) on 11/26/2019 6:16:26 PM certifies that he/she has  reviewed the ECG tracing and confirms the  independent interpretation is  Diagnostics:  CXR, PA and lateral, 11/25/2019 IMPRESSION: Normal  Echocardiogram, 11/25/2019  Left Ventricle Normal left ventricle size. Wall thickness is normal. EF: 55-60%. Wall motion is within normal limits. Doppler parameters consistent with mild diastolic dysfunction and low to normal LA pressure.  Right Ventricle Right ventricle cavity appears normal. Systolic function is normal.  Left Atrium Left atrium cavity size is normal.  Right Atrium Normal sized right atrium.  Mitral Valve The leaflets are not thickened. There is no mitral regurgitation.  Tricuspid Valve The tricuspid valve was not well visualized. There is no regurgitation. Unable to assess RVSP due to incomplete Doppler signal.  Aortic Valve The leaflets are not thickened. There is no regurgitation or stenosis.  Pulmonic Valve The pulmonic valve was not well visualized.  Ascending Aorta The aorta appears normal in size.  Pericardium There is no pericardial effusion.   NM Stress Test, 11/28/2019  FINDINGS: There is normal physiologic distribution of activity throughout the left ventricular myocardium. No evidence of ischemia. No regional wall motion abnormality. Ejection fraction of 60%.   IMPRESSION: Exam within normal limits   Depression screen Christian Escobar 2/9 11/18/2019 09/29/2016 09/01/2016  Decreased Interest 0 0 0  Down, Depressed, Hopeless 0 0 0  PHQ - 2 Score 0 0 0    Allergies  Allergen Reactions  . Lipitor [Atorvastatin] Other (See Comments)    Elevated LFTs, elevated CK with atorvastatin  . Lisinopril Cough   Social History   Tobacco Use  . Smoking status: Never Smoker  .  Smokeless tobacco: Never Used  Substance Use Topics  . Alcohol use: No   Past Medical History:  Diagnosis Date  . Allergy   . Asthma   . COVID-19 09/2019  . Elevated CK 12/02/2019  . Elevated LFTs 12/02/2019  . Hypertension    Past Surgical History:  Procedure  Laterality Date  . NO PAST SURGERIES     Family History  Problem Relation Age of Onset  . Hypertension Father   . Arthritis Father    Allergies as of 05/08/2020      Reactions   Lipitor [atorvastatin] Other (See Comments)   Elevated LFTs, elevated CK with atorvastatin   Lisinopril Cough      Medication List       Accurate as of May 08, 2020 11:59 PM. If you have any questions, ask your nurse or doctor.        Aspirin Adult Low Strength 81 MG EC tablet Generic drug: aspirin Take 1 tablet (81 mg total) by mouth daily.   colesevelam 625 MG tablet Commonly known as: Welchol Take 2 tablets (1,250 mg total) by mouth 2 (two) times daily with a meal.   hydrochlorothiazide 25 MG tablet Commonly known as: HYDRODIURIL Take 1 tablet (25 mg total) by mouth daily.   isosorbide mononitrate 30 MG 24 hr tablet Commonly known as: IMDUR Take 1 tablet (30 mg total) by mouth daily.   losartan 50 MG tablet Commonly known as: COZAAR Take 1 tablet (50 mg total) by mouth daily.   metoprolol tartrate 25 MG tablet Commonly known as: LOPRESSOR Take 1 tablet (25 mg total) by mouth 2 (two) times daily.       All past medical history, surgical history, allergies, family history, immunizations and medications were updated in the EMR today and reviewed under the history and medication portions of their EMR.      ROS: Negative, with the exception of above mentioned in HPI   Objective:  BP 134/87   Pulse 69   Temp 98.2 F (36.8 C) (Temporal)   Ht 5\' 9"  (1.753 m)   Wt (!) 326 lb (147.9 kg)   SpO2 99%   BMI 48.14 kg/m  Body mass index is 48.14 kg/m. Gen: Afebrile. No acute distress.  HENT: AT. Christian Escobar.  Eyes:Pupils Equal Round Reactive to light, Extraocular movements intact,  Conjunctiva without redness, discharge or icterus. Neck/lymp/endocrine: Supple, no lymphadenopathy, no thyromegaly CV: RRR no murmur, no edema, +2/4 P posterior tibialis pulses Chest: CTAB, no wheeze or  crackles Neuro:  Normal gait. PERLA. EOMi. Alert. Oriented x3  Psych: Normal affect, dress and demeanor. Normal speech. Normal thought content and judgment..   Assessment/Plan: Christian Escobar is a 44 y.o. male present for OV for Escobar discharge follow up Essential hypertension, benign/Severe obesity (BMI >= 40) (HCC)/hyperlipidemia/tachycardia Stable. Continue metoprolol Continue losartan 50 mg daily Continue HCTZ 25 mg daily (had been on combo of lisinopril-HCTZ) Continue Imdur> for now.  Cardiology to evaluate need to continue. Continue aspirin Continue WelChol 2 tabs twice daily for cholesterol Statin intolerant LFT and lipids collected today - Ambulatory referral to Cardiology placed last visit  OSA on CPAP: Improved since starting CPAP use   Reviewed expectations re: course of current medical issues.  Discussed self-management of symptoms.  Outlined signs and symptoms indicating need for more acute intervention.  Patient verbalized understanding and all questions were answered.  Patient received an After-Visit Summary.  Any changes in medications were reviewed and patient was provided with updated med list with  their AVS.     Follow-up in 3 months with repeat cholesterol check and chronic medical condition follow-up  Orders Placed This Encounter  Procedures  . Lipid panel  . Hepatic function panel   Meds ordered this encounter  Medications  . hydrochlorothiazide (HYDRODIURIL) 25 MG tablet    Sig: Take 1 tablet (25 mg total) by mouth daily.    Dispense:  90 tablet    Refill:  1  . isosorbide mononitrate (IMDUR) 30 MG 24 hr tablet    Sig: Take 1 tablet (30 mg total) by mouth daily.    Dispense:  90 tablet    Refill:  1  . losartan (COZAAR) 50 MG tablet    Sig: Take 1 tablet (50 mg total) by mouth daily.    Dispense:  90 tablet    Refill:  1  . metoprolol tartrate (LOPRESSOR) 25 MG tablet    Sig: Take 1 tablet (25 mg total) by mouth 2 (two) times daily.     Dispense:  180 tablet    Refill:  1  . colesevelam (WELCHOL) 625 MG tablet    Sig: Take 2 tablets (1,250 mg total) by mouth 2 (two) times daily with a meal.    Dispense:  120 tablet    Refill:  5   Referral Orders  No referral(s) requested today      Note is dictated utilizing voice recognition software. Although note has been proof read prior to signing, occasional typographical errors still can be missed. If any questions arise, please do not hesitate to call for verification.   electronically signed by:  Felix Pacini, DO  [facility] [place]

## 2020-05-08 NOTE — Patient Instructions (Signed)
Great to see you today.  Follow up 5.5 months.   BP looks good- meds refilled for you.

## 2020-05-10 ENCOUNTER — Encounter: Payer: Self-pay | Admitting: Family Medicine

## 2020-05-16 ENCOUNTER — Ambulatory Visit (INDEPENDENT_AMBULATORY_CARE_PROVIDER_SITE_OTHER): Payer: 59 | Admitting: Pulmonary Disease

## 2020-05-16 ENCOUNTER — Encounter: Payer: Self-pay | Admitting: Pulmonary Disease

## 2020-05-16 ENCOUNTER — Other Ambulatory Visit: Payer: Self-pay

## 2020-05-16 VITALS — BP 132/84 | HR 83 | Temp 98.1°F | Ht 69.0 in | Wt 322.0 lb

## 2020-05-16 DIAGNOSIS — I10 Essential (primary) hypertension: Secondary | ICD-10-CM | POA: Diagnosis not present

## 2020-05-16 DIAGNOSIS — Z9989 Dependence on other enabling machines and devices: Secondary | ICD-10-CM | POA: Diagnosis not present

## 2020-05-16 DIAGNOSIS — G4733 Obstructive sleep apnea (adult) (pediatric): Secondary | ICD-10-CM

## 2020-05-16 NOTE — Patient Instructions (Signed)
Patient with severe obstructive sleep apnea Tolerating CPAP well  No issues at present  Continue with CPAP use  Call with any significant concerns  Follow-up in 3 months

## 2020-05-16 NOTE — Progress Notes (Signed)
Subjective:    Patient ID: Christian Escobar, male    DOB: 05/16/1976, 44 y.o.   MRN: [id number]  Patient being seen for witnessed apneas, loud snoring, choking respirations at night Recently diagnosed with severe obstructive sleep apnea  Has been using CPAP pressure of 17 Gets about 6-1/2 hours to 7 hours on CPAP  He feels a lot better No headaches in the morning any longer Not as tired during the day He is seen significant improvement in how he feels on a general basis  Continues to lose weight Significant weight gain from about age 12 Weight about 180 when he was [age]  Has history of high blood pressure, asthma, hypercholesterolemia  Usually goes to bed between 8 and 10 PM, takes him about 30 minutes to fall asleep Wakes up about 2-3 times during the night Final awakening time about 6 AM  Before his diagnosis- Admits to dryness of his mouth in the mornings Admits to gasping respirations at night His memory is good Denies sleepy driving  Dad did have obstructive sleep apnea, was able to discontinue CPAP use with weight loss  Past Medical History:  Diagnosis Date  . Allergy   . Asthma   . COVID-19 09/2019  . Elevated CK 12/02/2019  . Elevated LFTs 12/02/2019  . Hypertension    Social History   Socioeconomic History  . Marital status: Married    Spouse name: Natalia Leatherwood  . Number of children: 0  . Years of education: 56  . Highest education level: Not on file  Occupational History  . Occupation: Art gallery manager  Tobacco Use  . Smoking status: Never Smoker  . Smokeless tobacco: Never Used  Substance and Sexual Activity  . Alcohol use: No  . Drug use: No  . Sexual activity: Yes    Partners: Female    Comment: married  Other Topics Concern  . Not on file  Social History Narrative   Married to [place]. No children.    BS. Div. Chief Financial Officer.    Drinks caffeine.    Wears seatbelt, bicycle helmet. Smoke detector in the home.    Exercises 3x a week.    Firearms  locked in the home.    Feels safe in his relationships.    Social Determinants of Health   Financial Resource Strain:   . Difficulty of Paying Living Expenses:   Food Insecurity:   . Worried About Programme researcher, broadcasting/film/video in the Last Year:   . Barista in the Last Year:   Transportation Needs:   . Freight forwarder (Medical):   Marland Kitchen Lack of Transportation (Non-Medical):   Physical Activity:   . Days of Exercise per Week:   . Minutes of Exercise per Session:   Stress:   . Feeling of Stress :   Social Connections:   . Frequency of Communication with Friends and Family:   . Frequency of Social Gatherings with Friends and Family:   . Attends Religious Services:   . Active Member of Clubs or Organizations:   . Attends Banker Meetings:   Marland Kitchen Marital Status:   Intimate Partner Violence:   . Fear of Current or Ex-Partner:   . Emotionally Abused:   Marland Kitchen Physically Abused:   . Sexually Abused:    Family History  Problem Relation Age of Onset  . Hypertension Father   . Arthritis Father    Review of Systems  Constitutional: Positive for unexpected weight change.  Respiratory: Positive for apnea.  Cardiovascular: Negative for chest pain.  Psychiatric/Behavioral: Positive for sleep disturbance.  All other systems reviewed and are negative.     Objective:   Physical Exam Constitutional:      Appearance: He is obese.  HENT:     Head: Normocephalic.     Nose: Nose normal. No congestion.     Mouth/Throat:     Mouth: Mucous membranes are moist.     Comments: Crowded, Mallampati 2 Eyes:     Pupils: Pupils are equal, round, and reactive to light.  Neck:     Comments: Neck size 18 Cardiovascular:     Pulses: Normal pulses.     Heart sounds: Normal heart sounds. No murmur heard.   Pulmonary:     Effort: Pulmonary effort is normal. No respiratory distress.     Breath sounds: Normal breath sounds. No stridor. No wheezing or rhonchi.  Musculoskeletal:      Cervical back: Normal range of motion. No rigidity.  Neurological:     General: No focal deficit present.     Mental Status: He is alert.    Vitals:   05/16/20 0954 05/16/20 0957  BP: (!) 132/84 (!) 132/84  Pulse: 83 83  Temp: 98.1 F (36.7 C) 98.1 F (36.7 C)  SpO2:  97%   Results of the Epworth flowsheet 12/07/2019  Sitting and reading 2  Watching TV 2  Sitting, inactive in a public place (e.g. a theatre or a meeting) 0  As a passenger in a car for an hour without a break 0  Lying down to rest in the afternoon when circumstances permit 2  Sitting and talking to someone 0  Sitting quietly after a lunch without alcohol 1  In a car, while stopped for a few minutes in traffic 0  Total score 7      Assessment & Plan:  Severe obstructive sleep apnea -Currently on CPAP of 17 -Tolerating CPAP well -Significant improvement in daytime symptoms with CPAP use  Morbid obesity  Pathophysiology of sleep disordered breathing discussed with the patient   Plan: Continue CPAP therapy -No changes need to be made at present   I will see him back in about 3 months  Encouraged to call with any significant concerns

## 2020-05-23 ENCOUNTER — Telehealth (INDEPENDENT_AMBULATORY_CARE_PROVIDER_SITE_OTHER): Payer: 59 | Admitting: Family Medicine

## 2020-05-23 DIAGNOSIS — J029 Acute pharyngitis, unspecified: Secondary | ICD-10-CM

## 2020-05-23 DIAGNOSIS — R110 Nausea: Secondary | ICD-10-CM

## 2020-05-23 MED ORDER — ONDANSETRON 8 MG PO TBDP
8.0000 mg | ORAL_TABLET | Freq: Three times a day (TID) | ORAL | 1 refills | Status: DC | PRN
Start: 2020-05-23 — End: 2020-07-02

## 2020-05-23 NOTE — Progress Notes (Signed)
Patient ID: Christian Escobar, male   DOB: 1975-10-26, 44 y.o.   MRN: [id number]  This visit type was conducted due to national recommendations for restrictions regarding the COVID-19 pandemic in an effort to limit this patient's exposure and mitigate transmission in our community.   Virtual Visit via Telephone Note  I connected with Francene Finders on 05/23/20 at  4:15 PM EDT by telephone and verified that I am speaking with the correct person using two identifiers.   I discussed the limitations, risks, security and privacy concerns of performing an evaluation and management service by telephone and the availability of in person appointments. I also discussed with the patient that there may be a patient responsible charge related to this service. The patient expressed understanding and agreed to proceed.  Location patient: home Location provider: work or home office Participants present for the call: patient, provider Patient did not have a visit in the prior 7 days to address this/these issue(s).   History of Present Illness: Kathlene November called with onset last Thursday of sore throat.  He states that when he first started having sore throat it felt somewhat like when he had Covid back in December.  He did get the J&J vaccine back in March.  Friday he had Covid test which came back negative.  He felt better over the weekend but then around 2 AM yesterday developed recurrent sore throat symptoms and he noticed some swelling of the uvula.  No erythema.  No exudate.  No fevers or chills.  No lymphadenopathy noted.  He recently was started on CPAP for obstructive sleep apnea.  He placed his CPAP on last night and felt very anxious and then ended up vomiting afterwards.  No recent chest pains.  He felt like some of this may have been anxiety related.  He has not had difficulty swallowing liquids today but appetite has been slightly diminished.  He still had some mild nausea today.  He took some Advil couple hours ago and  he felt like his sore throat symptoms were somewhat better.  He has not noted any exudate.  No active reflux symptoms recently  Past Medical History:  Diagnosis Date  . Allergy   . Asthma   . COVID-19 09/2019  . Elevated CK 12/02/2019  . Elevated LFTs 12/02/2019  . Hypertension    Past Surgical History:  Procedure Laterality Date  . NO PAST SURGERIES      reports that he has never smoked. He has never used smokeless tobacco. He reports that he does not drink alcohol and does not use drugs. family history includes Arthritis in his father; Hypertension in his father. Allergies  Allergen Reactions  . Lipitor [Atorvastatin] Other (See Comments)    Elevated LFTs, elevated CK with atorvastatin  . Lisinopril Cough      Observations/Objective: Patient sounds cheerful and well on the phone. I do not appreciate any SOB. Speech and thought processing are grossly intact. Patient reported vitals:  Assessment and Plan:  Patient describes sore throat symptoms intermittently past week with appearance of swollen uvula but he has not noted any erythema or exudate.  He wonders if some of this is irritation from recent initiation of CPAP.  He does not have any fever, redness, or exudate to suggest likely strep pharyngitis.  -We recommend conservative treatment with ice chips -Continue Advil as needed -Zofran 8 mg ODT 1 every 8 hours as needed for nausea -If sore throat symptoms persists or worsens needs office evaluation to further assess  Follow Up Instructions:  - as above   [id number] 5-10 [id number] 11-20 [id number] 21-30 I did not refer this patient for an OV in the next 24 hours for this/these issue(s).  I discussed the assessment and treatment plan with the patient. The patient was provided an opportunity to ask questions and all were answered. The patient agreed with the plan and demonstrated an understanding of the instructions.   The patient was advised to call back or seek an in-person  evaluation if the symptoms worsen or if the condition fails to improve as anticipated.  I provided 16 minutes of non-face-to-face time during this encounter.   Evelena Peat, MD

## 2020-07-02 ENCOUNTER — Other Ambulatory Visit: Payer: Self-pay

## 2020-07-02 ENCOUNTER — Ambulatory Visit: Payer: 59 | Admitting: Family Medicine

## 2020-07-02 ENCOUNTER — Telehealth: Payer: Self-pay

## 2020-07-02 ENCOUNTER — Encounter: Payer: Self-pay | Admitting: Family Medicine

## 2020-07-02 VITALS — BP 141/90 | HR 100 | Temp 98.2°F | Resp 16 | Wt 314.4 lb

## 2020-07-02 DIAGNOSIS — R05 Cough: Secondary | ICD-10-CM | POA: Diagnosis not present

## 2020-07-02 DIAGNOSIS — K219 Gastro-esophageal reflux disease without esophagitis: Secondary | ICD-10-CM

## 2020-07-02 DIAGNOSIS — R0789 Other chest pain: Secondary | ICD-10-CM

## 2020-07-02 DIAGNOSIS — R059 Cough, unspecified: Secondary | ICD-10-CM

## 2020-07-02 DIAGNOSIS — I10 Essential (primary) hypertension: Secondary | ICD-10-CM

## 2020-07-02 DIAGNOSIS — Z9989 Dependence on other enabling machines and devices: Secondary | ICD-10-CM

## 2020-07-02 DIAGNOSIS — G4733 Obstructive sleep apnea (adult) (pediatric): Secondary | ICD-10-CM

## 2020-07-02 MED ORDER — ISOSORBIDE MONONITRATE ER 30 MG PO TB24
30.0000 mg | ORAL_TABLET | Freq: Every day | ORAL | 5 refills | Status: DC
Start: 2020-07-02 — End: 2021-03-29

## 2020-07-02 MED ORDER — HYDROCHLOROTHIAZIDE 25 MG PO TABS
25.0000 mg | ORAL_TABLET | Freq: Every day | ORAL | 5 refills | Status: DC
Start: 2020-07-02 — End: 2021-01-21

## 2020-07-02 MED ORDER — FAMOTIDINE 20 MG PO TABS: 20.0000 mg | ORAL_TABLET | Freq: Two times a day (BID) | ORAL | 2 refills | Status: DC

## 2020-07-02 MED ORDER — OMEPRAZOLE 40 MG PO CPDR
40.0000 mg | DELAYED_RELEASE_CAPSULE | Freq: Every day | ORAL | 1 refills | Status: DC
Start: 2020-07-02 — End: 2020-07-02

## 2020-07-02 MED ORDER — LOSARTAN POTASSIUM 50 MG PO TABS: 50.0000 mg | ORAL_TABLET | Freq: Every day | ORAL | 5 refills | Status: DC

## 2020-07-02 MED ORDER — OMEPRAZOLE 40 MG PO CPDR
40.0000 mg | DELAYED_RELEASE_CAPSULE | Freq: Every day | ORAL | 5 refills | Status: DC
Start: 2020-07-02 — End: 2020-12-14

## 2020-07-02 NOTE — Telephone Encounter (Signed)
Please advise, Pt requesting same day visit for "lung pain"

## 2020-07-02 NOTE — Patient Instructions (Addendum)
Start the omeprazole and pepcid today.  I will call you with lab results.  If not seeing any improvement in 2-4 weeks, then call your pulmonologist to ensure this is not related to your cpap settings.  If labs do not show cause, you are not improving on the meds and your Pulm doe snot feel related to CPAP> then we would need to consider Gastroenterology referral.      Hiatal Hernia  A hiatal hernia occurs when part of the stomach slides above the muscle that separates the abdomen from the chest (diaphragm). A person can be born with a hiatal hernia (congenital), or it may develop over time. In almost all cases of hiatal hernia, only the top part of the stomach pushes through the diaphragm. Many people have a hiatal hernia with no symptoms. The larger the hernia, the more likely it is that you will have symptoms. In some cases, a hiatal hernia allows stomach acid to flow back into the tube that carries food from your mouth to your stomach (esophagus). This may cause heartburn symptoms. Severe heartburn symptoms may mean that you have developed a condition called gastroesophageal reflux disease (GERD). What are the causes? This condition is caused by a weakness in the opening (hiatus) where the esophagus passes through the diaphragm to attach to the upper part of the stomach. A person may be born with a weakness in the hiatus, or a weakness can develop over time. What increases the risk? This condition is more likely to develop in:  Older people. Age is a major risk factor for a hiatal hernia, especially if you are over the age of 56.  Pregnant women.  People who are overweight.  People who have frequent constipation. What are the signs or symptoms? Symptoms of this condition usually develop in the form of GERD symptoms. Symptoms include:  Heartburn.  Belching.  Indigestion.  Trouble swallowing.  Coughing or wheezing.  Sore throat.  Hoarseness.  Chest pain.  Nausea and  vomiting. How is this diagnosed? This condition may be diagnosed during testing for GERD. Tests that may be done include:  X-rays of your stomach or chest.  An upper gastrointestinal (GI) series. This is an X-ray exam of your GI tract that is taken after you swallow a chalky liquid that shows up clearly on the X-ray.  Endoscopy. This is a procedure to look into your stomach using a thin, flexible tube that has a tiny camera and light on the end of it. How is this treated? This condition may be treated by:  Dietary and lifestyle changes to help reduce GERD symptoms.  Medicines. These may include: ? Over-the-counter antacids. ? Medicines that make your stomach empty more quickly. ? Medicines that block the production of stomach acid (H2 blockers). ? Stronger medicines to reduce stomach acid (proton pump inhibitors).  Surgery to repair the hernia, if other treatments are not helping. If you have no symptoms, you may not need treatment. Follow these instructions at home: Lifestyle and activity  Do not use any products that contain nicotine or tobacco, such as cigarettes and e-cigarettes. If you need help quitting, ask your health care provider.  Try to achieve and maintain a healthy body weight.  Avoid putting pressure on your abdomen. Anything that puts pressure on your abdomen increases the amount of acid that may be pushed up into your esophagus. ? Avoid bending over, especially after eating. ? Raise the head of your bed by putting blocks under the legs. This  keeps your head and esophagus higher than your stomach. ? Do not wear tight clothing around your chest or stomach. ? Try not to strain when having a bowel movement, when urinating, or when lifting heavy objects. Eating and drinking  Avoid foods that can worsen GERD symptoms. These may include: ? Fatty foods, like fried foods. ? Citrus fruits, like oranges or lemon. ? Other foods and drinks that contain acid, like orange  juice or tomatoes. ? Spicy food. ? Chocolate.  Eat frequent small meals instead of three large meals a day. This helps prevent your stomach from getting too full. ? Eat slowly. ? Do not lie down right after eating. ? Do not eat 1-2 hours before bed.  Do not drink beverages with caffeine. These include cola, coffee, cocoa, and tea.  Do not drink alcohol. General instructions  Take over-the-counter and prescription medicines only as told by your health care provider.  Keep all follow-up visits as told by your health care provider. This is important. Contact a health care provider if:  Your symptoms are not controlled with medicines or lifestyle changes.  You are having trouble swallowing.  You have coughing or wheezing that will not go away. Get help right away if:  Your pain is getting worse.  Your pain spreads to your arms, neck, jaw, teeth, or back.  You have shortness of breath.  You sweat for no reason.  You feel sick to your stomach (nauseous) or you vomit.  You vomit blood.  You have bright red blood in your stools.  You have black, tarry stools. This information is not intended to replace advice given to you by your health care provider. Make sure you discuss any questions you have with your health care provider. Document Revised: 09/18/2017 Document Reviewed: 05/11/2017 Elsevier Patient Education  2020 [employer]   Gastroesophageal Reflux Disease, Adult Gastroesophageal reflux (GER) happens when acid from the stomach flows up into the tube that connects the mouth and the stomach (esophagus). Normally, food travels down the esophagus and stays in the stomach to be digested. However, when a person has GER, food and stomach acid sometimes move back up into the esophagus. If this becomes a more serious problem, the person may be diagnosed with a disease called gastroesophageal reflux disease (GERD). GERD occurs when the reflux:  Happens often.  Causes  frequent or severe symptoms.  Causes problems such as damage to the esophagus. When stomach acid comes in contact with the esophagus, the acid may cause soreness (inflammation) in the esophagus. Over time, GERD may create small holes (ulcers) in the lining of the esophagus. What are the causes? This condition is caused by a problem with the muscle between the esophagus and the stomach (lower esophageal sphincter, or LES). Normally, the LES muscle closes after food passes through the esophagus to the stomach. When the LES is weakened or abnormal, it does not close properly, and that allows food and stomach acid to go back up into the esophagus. The LES can be weakened by certain dietary substances, medicines, and medical conditions, including:  Tobacco use.  Pregnancy.  Having a hiatal hernia.  Alcohol use.  Certain foods and beverages, such as coffee, chocolate, onions, and peppermint. What increases the risk? You are more likely to develop this condition if you:  Have an increased body weight.  Have a connective tissue disorder.  Use NSAID medicines. What are the signs or symptoms? Symptoms of this condition include:  Heartburn.  Difficult or painful  swallowing.  The feeling of having a lump in the throat.  Abitter taste in the mouth.  Bad breath.  Having a large amount of saliva.  Having an upset or bloated stomach.  Belching.  Chest pain. Different conditions can cause chest pain. Make sure you see your health care provider if you experience chest pain.  Shortness of breath or wheezing.  Ongoing (chronic) cough or a night-time cough.  Wearing away of tooth enamel.  Weight loss. How is this diagnosed? Your health care provider will take a medical history and perform a physical exam. To determine if you have mild or severe GERD, your health care provider may also monitor how you respond to treatment. You may also have tests, including:  A test to examine your  stomach and esophagus with a small camera (endoscopy).  A test thatmeasures the acidity level in your esophagus.  A test thatmeasures how much pressure is on your esophagus.  A barium swallow or modified barium swallow test to show the shape, size, and functioning of your esophagus. How is this treated? The goal of treatment is to help relieve your symptoms and to prevent complications. Treatment for this condition may vary depending on how severe your symptoms are. Your health care provider may recommend:  Changes to your diet.  Medicine.  Surgery. Follow these instructions at home: Eating and drinking   Follow a diet as recommended by your health care provider. This may involve avoiding foods and drinks such as: ? Coffee and tea (with or without caffeine). ? Drinks that containalcohol. ? Energy drinks and sports drinks. ? Carbonated drinks or sodas. ? Chocolate and cocoa. ? Peppermint and mint flavorings. ? Garlic and onions. ? Horseradish. ? Spicy and acidic foods, including peppers, chili powder, curry powder, vinegar, hot sauces, and barbecue sauce. ? Citrus fruit juices and citrus fruits, such as oranges, lemons, and limes. ? Tomato-based foods, such as red sauce, chili, salsa, and pizza with red sauce. ? Fried and fatty foods, such as donuts, french fries, potato chips, and high-fat dressings. ? High-fat meats, such as hot dogs and fatty cuts of red and white meats, such as rib eye steak, sausage, ham, and bacon. ? High-fat dairy items, such as whole milk, butter, and cream cheese.  Eat small, frequent meals instead of large meals.  Avoid drinking large amounts of liquid with your meals.  Avoid eating meals during the 2-3 hours before bedtime.  Avoid lying down right after you eat.  Do not exercise right after you eat. Lifestyle   Do not use any products that contain nicotine or tobacco, such as cigarettes, e-cigarettes, and chewing tobacco. If you need help  quitting, ask your health care provider.  Try to reduce your stress by using methods such as yoga or meditation. If you need help reducing stress, ask your health care provider.  If you are overweight, reduce your weight to an amount that is healthy for you. Ask your health care provider for guidance about a safe weight loss goal. General instructions  Pay attention to any changes in your symptoms.  Take over-the-counter and prescription medicines only as told by your health care provider. Do not take aspirin, ibuprofen, or other NSAIDs unless your health care provider told you to do so.  Wear loose-fitting clothing. Do not wear anything tight around your waist that causes pressure on your abdomen.  Raise (elevate) the head of your bed about 6 inches (15 cm).  Avoid bending over if this makes  your symptoms worse.  Keep all follow-up visits as told by your health care provider. This is important. Contact a health care provider if:  You have: ? New symptoms. ? Unexplained weight loss. ? Difficulty swallowing or it hurts to swallow. ? Wheezing or a persistent cough. ? A hoarse voice.  Your symptoms do not improve with treatment. Get help right away if you:  Have pain in your arms, neck, jaw, teeth, or back.  Feel sweaty, dizzy, or light-headed.  Have chest pain or shortness of breath.  Vomit and your vomit looks like blood or coffee grounds.  Faint.  Have stool that is bloody or black.  Cannot swallow, drink, or eat. Summary  Gastroesophageal reflux happens when acid from the stomach flows up into the esophagus. GERD is a disease in which the reflux happens often, causes frequent or severe symptoms, or causes problems such as damage to the esophagus.  Treatment for this condition may vary depending on how severe your symptoms are. Your health care provider may recommend diet and lifestyle changes, medicine, or surgery.  Contact a health care provider if you have new or  worsening symptoms.  Take over-the-counter and prescription medicines only as told by your health care provider. Do not take aspirin, ibuprofen, or other NSAIDs unless your health care provider told you to do so.  Keep all follow-up visits as told by your health care provider. This is important. This information is not intended to replace advice given to you by your health care provider. Make sure you discuss any questions you have with your health care provider. Document Revised: 04/14/2018 Document Reviewed: 04/14/2018 Elsevier Patient Education  2020 [employer]

## 2020-07-02 NOTE — Telephone Encounter (Signed)
Pt has no other symptoms other than "lung pain". Tested covid negative about 3 weeks ago. Is getting scheduled for 4 pm today.

## 2020-07-02 NOTE — Telephone Encounter (Signed)
Patient has "lung pain" that he describes as asthma like symptoms. He has been covid tested twice (negative). Patient is requesting same day appointment.

## 2020-07-02 NOTE — Telephone Encounter (Signed)
4 pm slot ok.  May be in person if desired> IF illness present > 10 days or covid tested and negative within last 10 days.

## 2020-07-02 NOTE — Progress Notes (Signed)
This visit occurred during the SARS-CoV-2 public health emergency.  Safety protocols were in place, including screening questions prior to the visit, additional usage of staff PPE, and extensive cleaning of exam room while observing appropriate contact time as indicated for disinfecting solutions.    Christian Escobar , 05-24-1976, 44 y.o., male MRN: [id number] Patient Care Team    Relationship Specialty Notifications Start End  Ma Hillock, DO PCP - General Family Medicine  09/01/16   Rhetta Mura., MD Referring Physician Cardiology  12/01/19     Chief Complaint  Patient presents with  . Pain    lung     Subjective: Pt presents for an OV with complaints of throat discomfort and lung pain of 6 weeks duration.  Patient reports he had mild dry throat and discomfort in July when last seen for his chronic conditions by this provider.  He also reported feeling like his uvula was swelling.  This was shortly after he started his CPAP and it was felt it was possibly related to getting use to his CPAP and drying of mucous membranes.  He states he does have a humidifier on his CPAP.  He was then seen on 05/23/2020 by another provider, with complaints of nausea and sore throat.  Was provided with Zofran for the nausea.  He has had his Covid testing during these times and it has been negative.  He did have Covid in December 2020.  He also had the J&J vaccine. This morning he was seen by the urgent care secondary to nausea, swollen throat feeling, sometimes difficulty swallowing and feeling a "cold sensation" or anxiety sensation midsternum when taking a deep breath.  He reports they repeated a chest x-ray which he was told was normal.  He reports he is having coughing fits in which his heart rate raises.  He feels when this occurs he then does have panic which creates some of his other symptoms.  At urgent care he was provided with prednisone and promethazine.  He reports after taking the  promethazine he noticed some difference and improvement in his symptoms.  He does feel like it is wearing off.  He states he has been tracking his oxygen saturations during this time and they are 94-96% on room air.  He has not had a fever.  Hypertension/obesity/hyperlipidemia: Pt reports compliance with metoprolol 25 mg twice daily, HCTZ 25 mg daily, Imdur 30 mg daily and Cozaar 50 mg daily. Blood pressures ranges at home than normal limits.Patient denies chest pain, shortness of breath, dizziness or lower extremity edema.  Pt takes a daily baby ASA.  Patient is statin intolerant-he has been switched over to [facility]   Depression screen [facility] 2/9 11/18/2019 09/29/2016 09/01/2016  Decreased Interest 0 0 0  Down, Depressed, Hopeless 0 0 0  PHQ - 2 Score 0 0 0    Allergies  Allergen Reactions  . Lipitor [Atorvastatin] Other (See Comments)    Elevated LFTs, elevated CK with atorvastatin  . Lisinopril Cough   Social History   Social History Narrative   Married to [place]. No children.    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    Drinks caffeine.    Wears seatbelt, bicycle helmet. Smoke detector in the home.    Exercises 3x a week.    Firearms locked in the home.    Feels safe in his relationships.    Past Medical History:  Diagnosis Date  . Allergy   . Asthma   .  COVID-19 09/2019  . Elevated CK 12/02/2019  . Elevated LFTs 12/02/2019  . Hypertension    Past Surgical History:  Procedure Laterality Date  . NO PAST SURGERIES     Family History  Problem Relation Age of Onset  . Hypertension Father   . Arthritis Father    Allergies as of 07/02/2020      Reactions   Lipitor [atorvastatin] Other (See Comments)   Elevated LFTs, elevated CK with atorvastatin   Lisinopril Cough      Medication List       Accurate as of July 02, 2020 11:59 PM. If you have any questions, ask your nurse or doctor.        STOP taking these medications   ondansetron 8 MG disintegrating tablet Commonly  known as: Zofran ODT Stopped by: Howard Pouch, DO     TAKE these medications   Aspirin Adult Low Strength 81 MG EC tablet Generic drug: aspirin Take 1 tablet (81 mg total) by mouth daily.   colesevelam 625 MG tablet Commonly known as: Welchol Take 2 tablets (1,250 mg total) by mouth 2 (two) times daily with a meal.   famotidine 20 MG tablet Commonly known as: PEPCID Take 1 tablet (20 mg total) by mouth 2 (two) times daily. Started by: Howard Pouch, DO   hydrochlorothiazide 25 MG tablet Commonly known as: HYDRODIURIL Take 1 tablet (25 mg total) by mouth daily.   isosorbide mononitrate 30 MG 24 hr tablet Commonly known as: IMDUR Take 1 tablet (30 mg total) by mouth daily.   losartan 50 MG tablet Commonly known as: COZAAR Take 1 tablet (50 mg total) by mouth daily.   metoprolol tartrate 25 MG tablet Commonly known as: LOPRESSOR Take 1 tablet (25 mg total) by mouth 2 (two) times daily.   omeprazole 40 MG capsule Commonly known as: PRILOSEC Take 1 capsule (40 mg total) by mouth daily. Started by: Howard Pouch, DO       All past medical history, surgical history, allergies, family history, immunizations andmedications were updated in the EMR today and reviewed under the history and medication portions of their EMR.     ROS: Negative, with the exception of above mentioned in HPI   Objective:  BP (!) 141/90 (BP Location: Left Arm, Patient Position: Sitting, Cuff Size: Large)   Pulse 100   Temp 98.2 F (36.8 C) (Oral)   Resp 16   Wt (!) 314 lb 6.4 oz (142.6 kg)   SpO2 96%   BMI 46.43 kg/m  Body mass index is 46.43 kg/m. Gen: Afebrile. No acute distress. Nontoxic in appearance, well developed, well nourished.  HENT: AT. [place]. Bilateral TM visualized without erythema or bulging.  External auditory canal is normal. MMM, no oral lesions. Bilateral nares without erythema, swelling or drainage. Throat without erythema or exudates.  Small posterior pharynx-no obvious  swelling present. Eyes:Pupils Equal Round Reactive to light, Extraocular movements intact,  Conjunctiva without redness, discharge or icterus. Neck/lymp/endocrine: Supple,no lymphadenopathy CV: RRR no murmur, no edema Chest: CTAB, no wheeze or crackles. Good air movement, normal resp effort.  Abd: Soft. NTND. BS present. Neuro: Normal gait. PERLA. EOMi. Alert. Oriented x3  Psych: Normal affect, dress and demeanor. Normal speech. Normal thought content and judgment.  No exam data present No results found. No results found for this or any previous visit (from the past 24 hour(s)).  Assessment/Plan: Delvin Hedeen is a 44 y.o. male present for OV for  Essential hypertension, benign/Severe obesity (BMI >= 40) ([place])  Mildly elevated today.  However, his blood pressure this morning in urgent care was 119/82. Continue to monitor closely. Changes today.  Chest discomfort/cough/OSA/GERD -Patient had a rather significant history of cough with GERD that responded well to PPI therapy which he discontinued at the beginning of the year. Although he denies any obvious GERD symptoms, his presentation could be GERD related versus hiatal hernia etc. He did seem to have a positive response to promethazine/H1 blocker today at urgent care. Lengthy discussion today and patient agreed to start omeprazole and Pepcid which were represcribed for him today. We will recheck liver enzymes and lipase today. - Comp Met (CMET) - Lipase -Consider CT chest if labs do not indicate cause and PPI start does not improve symptoms. -Patient will start PPI and Pepcid. If symptoms are not improving in 2 weeks he will call his pulmonologist to gather the thoughts if this could be CPAP related since timing does seem consistent with CPAP start. If they do not feel the CPAP is related and his symptoms continue despite therapy he will follow up here in 4 weeks in which we will consider CT and/or gastroenterology versus ENT  referral.   Reviewed expectations re: course of current medical issues.  Discussed self-management of symptoms.  Outlined signs and symptoms indicating need for more acute intervention.  Patient verbalized understanding and all questions were answered.  Patient received an After-Visit Summary.    Orders Placed This Encounter  Procedures  . Comp Met (CMET)  . Lipase   Meds ordered this encounter  Medications  . DISCONTD: omeprazole (PRILOSEC) 40 MG capsule    Sig: Take 1 capsule (40 mg total) by mouth daily.    Dispense:  90 capsule    Refill:  1  . famotidine (PEPCID) 20 MG tablet    Sig: Take 1 tablet (20 mg total) by mouth 2 (two) times daily.    Dispense:  60 tablet    Refill:  2  . losartan (COZAAR) 50 MG tablet    Sig: Take 1 tablet (50 mg total) by mouth daily.    Dispense:  30 tablet    Refill:  5  . hydrochlorothiazide (HYDRODIURIL) 25 MG tablet    Sig: Take 1 tablet (25 mg total) by mouth daily.    Dispense:  30 tablet    Refill:  5  . isosorbide mononitrate (IMDUR) 30 MG 24 hr tablet    Sig: Take 1 tablet (30 mg total) by mouth daily.    Dispense:  30 tablet    Refill:  5  . omeprazole (PRILOSEC) 40 MG capsule    Sig: Take 1 capsule (40 mg total) by mouth daily.    Dispense:  30 capsule    Refill:  5   Referral Orders  No referral(s) requested today     Note is dictated utilizing voice recognition software. Although note has been proof read prior to signing, occasional typographical errors still can be missed. If any questions arise, please do not hesitate to call for verification.   electronically signed by:  Howard Pouch, DO  [place]

## 2020-07-03 LAB — COMPREHENSIVE METABOLIC PANEL
AG Ratio: 1.6 (calc) (ref 1.0–2.5)
ALT: 49 U/L — ABNORMAL HIGH (ref 9–46)
AST: 26 U/L (ref 10–40)
Albumin: 4.9 g/dL (ref 3.6–5.1)
Alkaline phosphatase (APISO): 61 U/L (ref 36–130)
BUN: 16 mg/dL (ref 7–25)
CO2: 27 mmol/L (ref 20–32)
Calcium: 10.1 mg/dL (ref 8.6–10.3)
Chloride: 102 mmol/L (ref 98–110)
Creat: 1.07 mg/dL (ref 0.60–1.35)
Globulin: 3 g/dL (calc) (ref 1.9–3.7)
Glucose, Bld: 117 mg/dL — ABNORMAL HIGH (ref 65–99)
Potassium: 4.1 mmol/L (ref 3.5–5.3)
Sodium: 139 mmol/L (ref 135–146)
Total Bilirubin: 0.7 mg/dL (ref 0.2–1.2)
Total Protein: 7.9 g/dL (ref 6.1–8.1)

## 2020-07-03 LAB — LIPASE: Lipase: 29 U/L (ref 7–60)

## 2020-07-12 ENCOUNTER — Ambulatory Visit: Payer: 59 | Admitting: Pulmonary Disease

## 2020-07-12 ENCOUNTER — Encounter: Payer: Self-pay | Admitting: Pulmonary Disease

## 2020-07-12 ENCOUNTER — Other Ambulatory Visit: Payer: Self-pay

## 2020-07-12 ENCOUNTER — Ambulatory Visit (INDEPENDENT_AMBULATORY_CARE_PROVIDER_SITE_OTHER): Payer: 59

## 2020-07-12 VITALS — BP 130/80 | HR 66 | Temp 97.6°F | Ht 69.0 in | Wt 314.8 lb

## 2020-07-12 DIAGNOSIS — R0989 Other specified symptoms and signs involving the circulatory and respiratory systems: Secondary | ICD-10-CM

## 2020-07-12 DIAGNOSIS — K219 Gastro-esophageal reflux disease without esophagitis: Secondary | ICD-10-CM | POA: Diagnosis not present

## 2020-07-12 DIAGNOSIS — G4733 Obstructive sleep apnea (adult) (pediatric): Secondary | ICD-10-CM | POA: Diagnosis not present

## 2020-07-12 DIAGNOSIS — R0602 Shortness of breath: Secondary | ICD-10-CM | POA: Diagnosis not present

## 2020-07-12 DIAGNOSIS — Z9989 Dependence on other enabling machines and devices: Secondary | ICD-10-CM

## 2020-07-12 NOTE — Assessment & Plan Note (Signed)
Patient with recent weight gain Unfortunately this may have worsened the Mallampati score as well as crowded airway in the back of his throat  Plan: Referral to ENT for evaluation, patient may be candidate for surgical intervention Encourage patient to work on reducing weight

## 2020-07-12 NOTE — Progress Notes (Signed)
@Patient  ID: , male    DOB: May 24, 1976, 44 y.o.   MRN: [id number]  Chief Complaint  Patient presents with  . Follow-up    OSA    Referring provider: [id number], DO  HPI:  44 year old male never smoker followed in our office for severe obstructive sleep apnea  PMH: Morbid obesity, GERD, prediabetes, hyperlipidemia Smoker/ Smoking History: Never smoker Maintenance: None Pt of: Dr. 45  07/12/2020  - Visit   44 year old male never smoker followed in our office for severe obstructive sleep apnea.  Patient presenting to office today as he has been having a worsening globus sensation as well as increased shortness of breath over the last 2 to 3 weeks.  He feels that the CPAP may be worsening this.  He has been followed by primary care who started him on an H2 as well as PPI.  He feels that this has helped with symptoms.  Patient is also having trouble with swallowing.  He has never had a formal swallow eval.  Patient CPAP compliance were listed below:  06/12/2020-07/11/2020-CPAP compliance report-19 on the last 30 days used, 17 of those days greater than 4 hours, average usage 6 hours and 29 minutes, CPAP set pressure 17, AHI 0.9  Patient feels he has been having worsened swallowing.  He is unsure if this is due to his crowded airway.  He has never been evaluated by ear nose and throat.  Tests:   FENO:  No results found for: NITRICOXIDE  PFT: No flowsheet data found.  WALK:  No flowsheet data found.  Imaging: No results found.  Lab Results:  CBC    Component Value Date/Time   WBC 7.4 11/18/2019 0839   RBC 5.13 11/18/2019 0839   HGB 15.5 11/18/2019 0839   HCT 45.5 11/18/2019 0839   PLT 294.0 11/18/2019 0839   MCV 88.8 11/18/2019 0839   MCHC 34.1 11/18/2019 0839   RDW 14.2 11/18/2019 0839   LYMPHSABS 2.8 09/01/2016 0930   MONOABS 0.9 09/01/2016 0930   EOSABS 0.3 09/01/2016 0930   BASOSABS 0.0 09/01/2016 0930    BMET    Component Value Date/Time    NA 139 07/02/2020 1646   K 4.1 07/02/2020 1646   CL 102 07/02/2020 1646   CO2 27 07/02/2020 1646   GLUCOSE 117 (H) 07/02/2020 1646   BUN 16 07/02/2020 1646   CREATININE 1.07 07/02/2020 1646   CALCIUM 10.1 07/02/2020 1646    BNP No results found for: BNP  ProBNP No results found for: PROBNP  Specialty Problems      Pulmonary Problems   OSA on CPAP    Trial of CPAP therapy on 17 cm H2O with EPR 2, a Medium size [employer] Full Face Mask Simplus mask and heated humidification. - Avoid alcohol, sedatives and other CNS depressants that may worsen sleep apnea and disrupt normal sleep architecture. - Sleep hygiene should be reviewed to assess factors that may improve sleep quality. - Weight management and regular exercise should be initiated or continued. - [facility] after 4 weeks of therapy      Globus sensation   Shortness of breath      Allergies  Allergen Reactions  . Lipitor [Atorvastatin] Other (See Comments)    Elevated LFTs, elevated CK with atorvastatin  . Lisinopril Cough    Immunization History  Administered Date(s) Administered  . Influenza,inj,Quad PF,6+ Mos 09/10/2016, 11/06/2019, 11/18/2019  . Janssen (J&J) SARS-COV-2 Vaccination 01/01/2020  . Tdap 09/29/2016  Past Medical History:  Diagnosis Date  . Allergy   . Asthma   . COVID-19 09/2019  . Elevated CK 12/02/2019  . Elevated LFTs 12/02/2019  . Hypertension     Tobacco History: Social History   Tobacco Use  Smoking Status Never Smoker  Smokeless Tobacco Never Used   Counseling given: Not Answered   Continue to not smoke  Outpatient Encounter Medications as of 07/12/2020  Medication Sig  . ASPIRIN ADULT LOW STRENGTH 81 MG EC tablet Take 1 tablet (81 mg total) by mouth daily.  . colesevelam (WELCHOL) 625 MG tablet Take 2 tablets (1,250 mg total) by mouth 2 (two) times daily with a meal.  . famotidine (PEPCID) 20 MG tablet Take 1 tablet (20 mg total) by  mouth 2 (two) times daily.  . hydrochlorothiazide (HYDRODIURIL) 25 MG tablet Take 1 tablet (25 mg total) by mouth daily.  . isosorbide mononitrate (IMDUR) 30 MG 24 hr tablet Take 1 tablet (30 mg total) by mouth daily.  Marland Kitchen losartan (COZAAR) 50 MG tablet Take 1 tablet (50 mg total) by mouth daily.  . metoprolol tartrate (LOPRESSOR) 25 MG tablet Take 1 tablet (25 mg total) by mouth 2 (two) times daily.  Marland Kitchen omeprazole (PRILOSEC) 40 MG capsule Take 1 capsule (40 mg total) by mouth daily.   No facility-administered encounter medications on file as of 07/12/2020.     Review of Systems  Review of Systems  Constitutional: Positive for fatigue. Negative for activity change, chills, fever and unexpected weight change.  HENT: Positive for trouble swallowing. Negative for postnasal drip, rhinorrhea, sinus pressure, sinus pain and sore throat.   Eyes: Negative.   Respiratory: Positive for shortness of breath. Negative for cough and wheezing.   Cardiovascular: Negative for chest pain and palpitations.  Gastrointestinal: Negative for diarrhea, nausea and vomiting.  Endocrine: Negative.   Genitourinary: Negative.   Musculoskeletal: Negative.   Skin: Negative.   Neurological: Negative for dizziness and headaches.  Psychiatric/Behavioral: Negative.  Negative for dysphoric mood. The patient is not nervous/anxious.   All other systems reviewed and are negative.    Physical Exam  BP 130/80 (BP Location: Left Arm, Cuff Size: Normal)   Pulse 66   Temp 97.6 F (36.4 C) (Oral)   Ht 5\' 9"  (1.753 m)   Wt (!) 314 lb 12.8 oz (142.8 kg)   SpO2 97%   BMI 46.49 kg/m   Wt Readings from Last 5 Encounters:  07/12/20 (!) 314 lb 12.8 oz (142.8 kg)  07/02/20 (!) 314 lb 6.4 oz (142.6 kg)  05/16/20 (!) 322 lb (146.1 kg)  05/08/20 (!) 326 lb (147.9 kg)  02/26/20 300 lb (136.1 kg)    BMI Readings from Last 5 Encounters:  07/12/20 46.49 kg/m  07/02/20 46.43 kg/m  05/16/20 47.55 kg/m  05/08/20 48.14 kg/m    02/26/20 44.30 kg/m     Physical Exam Vitals and nursing note reviewed.  Constitutional:      General: He is not in acute distress.    Appearance: Normal appearance. He is obese.  HENT:     Head: Normocephalic and atraumatic.     Right Ear: Hearing and external ear normal.     Left Ear: Hearing and external ear normal.     Nose: Nose normal. No mucosal edema or rhinorrhea.     Right Turbinates: Not enlarged.     Left Turbinates: Not enlarged.     Mouth/Throat:     Mouth: Mucous membranes are dry.     Pharynx:  Oropharynx is clear. Uvula midline. No oropharyngeal exudate.     Comments: Very crowded oral airway, Mallampati 4 Eyes:     Pupils: Pupils are equal, round, and reactive to light.  Neck:     Thyroid: No thyroid mass or thyromegaly.     Comments: No upper airway wheeze on exam Cardiovascular:     Rate and Rhythm: Normal rate and regular rhythm.     Pulses: Normal pulses.     Heart sounds: Normal heart sounds. No murmur heard.   Pulmonary:     Effort: Pulmonary effort is normal.     Breath sounds: Normal breath sounds. No decreased breath sounds, wheezing or rales.  Musculoskeletal:     Cervical back: Normal range of motion.     Right lower leg: No edema.     Left lower leg: No edema.  Lymphadenopathy:     Cervical: No cervical adenopathy.  Skin:    General: Skin is warm and dry.     Capillary Refill: Capillary refill takes less than 2 seconds.     Findings: No erythema or rash.  Neurological:     General: No focal deficit present.     Mental Status: He is alert and oriented to person, place, and time.     Motor: No weakness.     Coordination: Coordination normal.     Gait: Gait is intact. Gait normal.  Psychiatric:        Mood and Affect: Mood normal.        Behavior: Behavior normal. Behavior is cooperative.        Thought Content: Thought content normal.        Judgment: Judgment normal.       Assessment & Plan:   OSA on CPAP Plan: Resume CPAP  therapy Referral to ENT  Gastroesophageal reflux disease Plan: Continue PPI Continue H2 blocker Continue to work with primary care  Globus sensation Patient with recent weight gain Unfortunately this may have worsened the Mallampati score as well as crowded airway in the back of his throat  Plan: Referral to ENT for evaluation, patient may be candidate for surgical intervention Encourage patient to work on reducing weight  Severe obesity (BMI >= 40) (HCC) Plan: Offered referral to medical weight management, patient declined Patient to work with primary care on reducing BMI   Shortness of breath Plan: Chest x-ray today    Return in about 2 months (around 09/11/2020), or if symptoms worsen or fail to improve, for Follow up with Dr. Wynona Neat.   Coral Ceo, NP 07/12/2020   This appointment required 32 minutes of patient care (this includes precharting, chart review, review of results, face-to-face care, etc.).

## 2020-07-12 NOTE — Assessment & Plan Note (Signed)
Plan: Continue PPI Continue H2 blocker Continue to work with primary care

## 2020-07-12 NOTE — Assessment & Plan Note (Signed)
Plan: Offered referral to medical weight management, patient declined Patient to work with primary care on reducing BMI

## 2020-07-12 NOTE — Assessment & Plan Note (Signed)
Plan: Resume CPAP therapy Referral to ENT

## 2020-07-12 NOTE — Patient Instructions (Addendum)
You were seen today by Coral Ceo, NP  for:   1. OSA on CPAP  - Ambulatory referral to ENT  We recommend that you continue using your CPAP daily >>>Keep up the hard work using your device >>> Goal should be wearing this for the entire night that you are sleeping, at least 4 to 6 hours  Remember:  . Do not drive or operate heavy machinery if tired or drowsy.  . Please notify the supply company and office if you are unable to use your device regularly due to missing supplies or machine being broken.  . Work on maintaining a healthy weight and following your recommended nutrition plan  . Maintain proper daily exercise and movement  . Maintaining proper use of your device can also help improve management of other chronic illnesses such as: Blood pressure, blood sugars, and weight management.   BiPAP/ CPAP Cleaning:  >>>Clean weekly, with Dawn soap, and bottle brush.  Set up to air dry. >>> Wipe mask out daily with wet wipe or towelette   2. Shortness of breath  - DG Chest 2 View; Future - Ambulatory referral to ENT  3. Gastroesophageal reflux disease, unspecified whether esophagitis present  Continue medications as managed by primary care  4. Severe obesity (BMI >= 40) (HCC)  Offered referral to medical weight management, you declined  Continue to work with primary care on working to reduce your BMI.  As discussed today reducing your BMI can help improve the oral airway spacing  5. Globus sensation  - Ambulatory referral to ENT   We recommend today:  Orders Placed This Encounter  Procedures  . DG Chest 2 View    Standing Status:   Future    Number of Occurrences:   1    Standing Expiration Date:   11/11/2020    Order Specific Question:   Reason for Exam (SYMPTOM  OR DIAGNOSIS REQUIRED)    Answer:   short of breath    Order Specific Question:   Preferred imaging location?    Answer:   [facility]    Order Specific Question:   Radiology Contrast Protocol - do  NOT remove file path    Answer:   [email or web address]  . Ambulatory referral to ENT    Referral Priority:   Urgent    Referral Type:   Consultation    Referral Reason:   Specialty Services Required    Requested Specialty:   Otolaryngology    Number of Visits Requested:   1   Orders Placed This Encounter  Procedures  . DG Chest 2 View  . Ambulatory referral to ENT   No orders of the defined types were placed in this encounter.   Follow Up:    Return in about 2 months (around 09/11/2020), or if symptoms worsen or fail to improve, for Follow up with Dr. Wynona Neat.   Notification of test results are managed in the following manner: If there are  any recommendations or changes to the  plan of care discussed in office today,  we will contact you and let you know what they are. If you do not hear from [place], then your results are normal and you can view them through your  MyChart account , or a letter will be sent to you. Thank you again for trusting [place] with your care  - Thank you, [facility]    It is flu season:   >>> Best ways to protect herself from the flu: Receive  the yearly flu vaccine, practice good hand hygiene washing with soap and also using hand sanitizer when available, eat a nutritious meals, get adequate rest, hydrate appropriately       Please contact the office if your symptoms worsen or you have concerns that you are not improving.   Thank you for choosing [facility] for your healthcare, and for allowing [place] to partner with you on your healthcare journey. I am thankful to be able to provide care to you today.   Elisha Headland FNP-C

## 2020-07-12 NOTE — Assessment & Plan Note (Signed)
Plan: Chest x-ray today 

## 2020-07-13 ENCOUNTER — Other Ambulatory Visit: Payer: Self-pay | Admitting: Pulmonary Disease

## 2020-07-13 DIAGNOSIS — R0602 Shortness of breath: Secondary | ICD-10-CM

## 2020-07-20 ENCOUNTER — Other Ambulatory Visit: Payer: Self-pay

## 2020-07-20 ENCOUNTER — Ambulatory Visit (INDEPENDENT_AMBULATORY_CARE_PROVIDER_SITE_OTHER)
Admission: RE | Admit: 2020-07-20 | Discharge: 2020-07-20 | Disposition: A | Discharge status: Home / Self Care | Payer: 59 | Source: Ambulatory Visit | Attending: Pulmonary Disease | Admitting: Pulmonary Disease

## 2020-07-20 DIAGNOSIS — R0602 Shortness of breath: Secondary | ICD-10-CM

## 2020-07-23 NOTE — Telephone Encounter (Signed)
Christian Escobar patient sent email regarding CT results, let patient know it would be up to 48 hours before response.  Please advise

## 2020-07-24 ENCOUNTER — Other Ambulatory Visit: Payer: Self-pay | Admitting: Pulmonary Disease

## 2020-07-24 ENCOUNTER — Encounter: Payer: Self-pay | Admitting: Family Medicine

## 2020-07-24 DIAGNOSIS — J029 Acute pharyngitis, unspecified: Secondary | ICD-10-CM

## 2020-07-24 DIAGNOSIS — R0989 Other specified symptoms and signs involving the circulatory and respiratory systems: Secondary | ICD-10-CM

## 2020-07-24 DIAGNOSIS — R911 Solitary pulmonary nodule: Secondary | ICD-10-CM

## 2020-07-24 DIAGNOSIS — R0789 Other chest pain: Secondary | ICD-10-CM

## 2020-07-24 DIAGNOSIS — K219 Gastro-esophageal reflux disease without esophagitis: Secondary | ICD-10-CM

## 2020-07-24 NOTE — Telephone Encounter (Signed)
Please advise on message below.

## 2020-07-24 NOTE — Telephone Encounter (Signed)
07/24/2020  CT chest shows no acute abnormality.  There are some small pulmonary nodules.  We do not typically start to be concerned about these until they are over 8 mm.  There is were 5 mm or smaller.  You are also a never smoker.  We can plan on doing 1 repeat CT scan in 12 months.  A noncontrast chest CT.  Please also route this message to primary care as FYI.  Would recommend patient proceeding forward with ear nose and throat evaluation.  No new pulmonary recommendations at this time.  Elisha Headland, FNP

## 2020-07-30 ENCOUNTER — Encounter: Payer: Self-pay | Admitting: Family Medicine

## 2020-08-04 ENCOUNTER — Encounter: Payer: Self-pay | Admitting: Family Medicine

## 2020-08-14 DIAGNOSIS — R198 Other specified symptoms and signs involving the digestive system and abdomen: Secondary | ICD-10-CM | POA: Insufficient documentation

## 2020-08-14 DIAGNOSIS — J310 Chronic rhinitis: Secondary | ICD-10-CM | POA: Insufficient documentation

## 2020-08-14 DIAGNOSIS — R0989 Other specified symptoms and signs involving the circulatory and respiratory systems: Secondary | ICD-10-CM | POA: Insufficient documentation

## 2020-09-07 ENCOUNTER — Encounter: Payer: Self-pay | Admitting: Gastroenterology

## 2020-09-07 ENCOUNTER — Ambulatory Visit: Payer: 59 | Admitting: Gastroenterology

## 2020-09-07 VITALS — BP 128/90 | HR 72 | Ht 68.0 in | Wt 286.5 lb

## 2020-09-07 DIAGNOSIS — R0789 Other chest pain: Secondary | ICD-10-CM

## 2020-09-07 DIAGNOSIS — R1310 Dysphagia, unspecified: Secondary | ICD-10-CM | POA: Diagnosis not present

## 2020-09-07 DIAGNOSIS — R198 Other specified symptoms and signs involving the digestive system and abdomen: Secondary | ICD-10-CM

## 2020-09-07 DIAGNOSIS — Z8719 Personal history of other diseases of the digestive system: Secondary | ICD-10-CM

## 2020-09-07 DIAGNOSIS — R0989 Other specified symptoms and signs involving the circulatory and respiratory systems: Secondary | ICD-10-CM

## 2020-09-07 NOTE — Patient Instructions (Signed)
If you are age 42 or older, your body mass index should be between 23-30. Your Body mass index is 43.56 kg/m. If this is out of the aforementioned range listed, please consider follow up with your Primary Care Provider.  If you are age 23 or younger, your body mass index should be between 19-25. Your Body mass index is 43.56 kg/m. If this is out of the aformentioned range listed, please consider follow up with your Primary Care Provider.    You have been scheduled for an endoscopy. Please follow written instructions given to you at your visit today. If you use inhalers (even only as needed), please bring them with you on the day of your procedure.   Thank you for choosing me and [facility].  Dr. Meridee Score

## 2020-09-10 ENCOUNTER — Encounter: Payer: Self-pay | Admitting: Family Medicine

## 2020-09-10 NOTE — Telephone Encounter (Signed)
Patient should be encouraged to schedule appointment if desiring further work-up.

## 2020-09-11 ENCOUNTER — Encounter: Payer: Self-pay | Admitting: Gastroenterology

## 2020-09-11 DIAGNOSIS — R1310 Dysphagia, unspecified: Secondary | ICD-10-CM | POA: Insufficient documentation

## 2020-09-11 DIAGNOSIS — Z8719 Personal history of other diseases of the digestive system: Secondary | ICD-10-CM | POA: Insufficient documentation

## 2020-09-11 NOTE — Progress Notes (Signed)
GASTROENTEROLOGY OUTPATIENT CLINIC VISIT   Primary Care Provider Natalia Leatherwood, DO [address] [place] [address] [phone]  Referring Provider Natalia Leatherwood, DO [address] [place],  [place] [address] [phone]  Patient Profile: Christian Escobar is a 44 y.o. male with a pmh significant for asthma, hypertension, OSA, GERD.  The patient presents to the [facility] for an evaluation and management of problem(s) noted below:  Problem List 1. Dysphagia, unspecified type   2. History of gastroesophageal reflux (GERD)   3. Globus sensation   4. Atypical chest pain     History of Present Illness This is the patient's first visit to the outpatient [facility].  He has never seen a gastroenterologist in the past.  The patient states that he has had GERD symptoms for years but for many months he has not had any symptoms.  He was recently initiated on PPI therapy but did not feel that there was any significant difference in regards to his acid reflux/pyrosis because he was already not having issues.  He has had some increased amounts of belching and eructation.  The patient has describes some issues with difficulty swallowing.  He feels that he has a swollen uvula especially in the early morning towards the back of his throat with the need for clearing mucus although no mucus actually comes up.  The patient has also been experiencing left-sided discomfort when he lays on his stomach which feels as if there is a blunt finger poking under his diaphragm.  He has had a slightly hoarse voice as well as some chest pressure that occurs after stress and after eating.  The patient has had symptoms really began since July after initiation of his CPAP for sleep apnea.  The patient has had history of abnormal LFTs the most recent labs just a few months ago has been normal.  CT chest suggested that the patient may have some hepatic steatosis.  The patient's weight has been stable  for years but most recently has had a nice weight loss intentionally with changes in his diet..  The patient does not drink alcohol.  The patient has a normal bowel movement on a daily basis without any blood.  He has seen ENT which has felt that his symptoms may likely be GERD related though we do not have access to those records.  GI Review of Systems Positive as above Negative for odynophagia, nausea, vomiting, abdominal pain, change in bowel habits  Review of Systems General: Denies fevers/chills/weight loss/night sweats HEENT: Denies oral lesions/sore throat/headaches/visual changes Cardiovascular: Denies chest pain/palpitations Pulmonary: Denies shortness of breath/cough Gastroenterological: See HPI Genitourinary: Denies darkened urine or hematuria Hematological: Denies easy bruising/bleeding Endocrine: Denies temperature intolerance Dermatological: Denies skin changes Psychological: Mood is stable Allergy & Immunology: Denies severe allergic reactions Musculoskeletal: Denies new arthralgias   Medications Current Outpatient Medications  Medication Sig Dispense Refill  . ASPIRIN ADULT LOW STRENGTH 81 MG EC tablet Take 1 tablet (81 mg total) by mouth daily. 90 tablet 3  . colesevelam (WELCHOL) 625 MG tablet Take 2 tablets (1,250 mg total) by mouth 2 (two) times daily with a meal. 120 tablet 5  . famotidine (PEPCID) 20 MG tablet Take 1 tablet (20 mg total) by mouth 2 (two) times daily. 60 tablet 2  . fluticasone (FLONASE) 50 MCG/ACT nasal spray Place 1 spray into both nostrils at bedtime.    . hydrochlorothiazide (HYDRODIURIL) 25 MG tablet Take 1 tablet (25 mg total) by  mouth daily. 30 tablet 5  . isosorbide mononitrate (IMDUR) 30 MG 24 hr tablet Take 1 tablet (30 mg total) by mouth daily. 30 tablet 5  . losartan (COZAAR) 50 MG tablet Take 1 tablet (50 mg total) by mouth daily. 30 tablet 5  . metoprolol tartrate (LOPRESSOR) 25 MG tablet Take 1 tablet (25 mg total) by mouth 2 (two)  times daily. 180 tablet 1  . omeprazole (PRILOSEC) 40 MG capsule Take 1 capsule (40 mg total) by mouth daily. 30 capsule 5   No current facility-administered medications for this visit.    Allergies Allergies  Allergen Reactions  . Lipitor [Atorvastatin] Other (See Comments)    Elevated LFTs, elevated CK with atorvastatin  . Lisinopril Cough    Histories Past Medical History:  Diagnosis Date  . Allergy   . Asthma   . COVID-19 09/2019  . Elevated CK 12/02/2019  . Elevated LFTs 12/02/2019  . GERD (gastroesophageal reflux disease)   . Hypertension   . Sleep apnea with use of continuous positive airway pressure (CPAP)    Past Surgical History:  Procedure Laterality Date  . NO PAST SURGERIES     Social History   Socioeconomic History  . Marital status: Married    Spouse name: Natalia Leatherwood  . Number of children: 0  . Years of education: 71  . Highest education level: Not on file  Occupational History  . Occupation: Administrator, sports  Tobacco Use  . Smoking status: Never Smoker  . Smokeless tobacco: Never Used  Vaping Use  . Vaping Use: Never used  Substance and Sexual Activity  . Alcohol use: No  . Drug use: No  . Sexual activity: Yes    Partners: Female    Comment: married  Other Topics Concern  . Not on file  Social History Narrative   Married to [place]. No children.    BS. Div. Chief Financial Officer.    Drinks caffeine.    Wears seatbelt, bicycle helmet. Smoke detector in the home.    Exercises 3x a week.    Firearms locked in the home.    Feels safe in his relationships.    Social Determinants of Health   Financial Resource Strain:   . Difficulty of Paying Living Expenses: Not on file  Food Insecurity:   . Worried About Programme researcher, broadcasting/film/video in the Last Year: Not on file  . Ran Out of Food in the Last Year: Not on file  Transportation Needs:   . Lack of Transportation (Medical): Not on file  . Lack of Transportation (Non-Medical): Not on file   Physical Activity:   . Days of Exercise per Week: Not on file  . Minutes of Exercise per Session: Not on file  Stress:   . Feeling of Stress : Not on file  Social Connections:   . Frequency of Communication with Friends and Family: Not on file  . Frequency of Social Gatherings with Friends and Family: Not on file  . Attends Religious Services: Not on file  . Active Member of Clubs or Organizations: Not on file  . Attends Banker Meetings: Not on file  . Marital Status: Not on file  Intimate Partner Violence:   . Fear of Current or Ex-Partner: Not on file  . Emotionally Abused: Not on file  . Physically Abused: Not on file  . Sexually Abused: Not on file   Family History  Problem Relation Age of Onset  . Hypertension Father   . Arthritis  Father   . GER disease Father   . Colon cancer Neg Hx   . Esophageal cancer Neg Hx   . Inflammatory bowel disease Neg Hx   . Liver disease Neg Hx   . Pancreatic cancer Neg Hx   . Rectal cancer Neg Hx   . Stomach cancer Neg Hx    I have reviewed his medical, social, and family history in detail and updated the electronic medical record as necessary.    PHYSICAL EXAMINATION  BP 128/90 (BP Location: Left Arm, Patient Position: Sitting, Cuff Size: Large)   Pulse 72   Ht 5\' 8"  (1.727 m) Comment: height measured without shoes  Wt 286 lb 8 oz (130 kg)   BMI 43.56 kg/m  Wt Readings from Last 3 Encounters:  09/07/20 286 lb 8 oz (130 kg)  07/12/20 (!) 314 lb 12.8 oz (142.8 kg)  07/02/20 (!) 314 lb 6.4 oz (142.6 kg)  GEN: NAD, appears stated age, doesn't appear chronically ill PSYCH: Cooperative, without pressured speech EYE: Conjunctivae pink, sclerae anicteric ENT: MMM, without oral ulcers, no erythema or exudates noted, Mallampati 2 NECK: Supple, enlarged neck girth CV: RR without R/Gs  RESP: CTAB posteriorly, without wheezing GI: NABS, soft, protuberant abdomen, nontender, without rebound or guarding, no HSM  appreciated MSK/EXT: No lower extremity edema SKIN: No jaundice NEURO:  Alert & Oriented x 3, no focal deficits case   REVIEW OF DATA  I reviewed the following data at the time of this encounter:  GI Procedures and Studies  No relevant studies to review  Laboratory Studies  Reviewed those in epic  Imaging Studies  October 2021 CT chest IMPRESSION: 1. No acute abnormality of the chest. No CT findings to explain shortness of breath. 2. Occasional small pulmonary nodules measuring 5 mm or smaller. No follow-up needed if patient is low-risk (and has no known or suspected primary neoplasm). Non-contrast chest CT can be considered in 12 months if patient is high-risk. This recommendation follows the consensus statement: Guidelines for Management of Incidental Pulmonary Nodules Detected on CT Images: From the Fleischner Society 2017; Radiology 2017; 284:228-243. 3. Hepatic steatosis.  September 2021 chest x-ray IMPRESSION: 1. Low lung volumes. Mild bilateral interstitial prominence. Mild interstitial edema and/or pneumonitis can not be excluded. 2.  Cardiomegaly.  No pulmonary venous congestion.   ASSESSMENT  Mr. Kingry is a 44 y.o. male with a pmh significant for asthma, hypertension, OSA, GERD.  The patient is seen today for evaluation and management of:  1. Dysphagia, unspecified type   2. History of gastroesophageal reflux (GERD)   3. Globus sensation   4. Atypical chest pain    The patient is hemodynamically stable.  Clinically however he has continued to have issues that are not clearly defined as being GI related.  His longer standing history of GERD although he had been well controlled for the last nearly 12 months since January 2021 make the manifestation of potential extra esophageal symptoms seem much less likely.  With that being said it is reasonable to maintain PPI therapy for now.  We certainly can perform a diagnostic endoscopy to further exclude etiologies of  potential symptoms including a hiatal hernia and/or esophagitis.  If the patient's endoscopy is unremarkable 1 to query possibly doing pH impedance testing to see if extra esophageal manifestations of GERD are occurring although is not clear that they are.  I certainly believe that his symptoms in the relation of his sleep apnea and CPAP use are likely result of  that most likely although he needs that for optimization of his health.  Eructation of extra air could be causing some issues in regards to his belching.  An empiric dilation will be considered at time of endoscopy as well as EOE biopsies.  The risks and benefits of endoscopic evaluation were discussed with the patient; these include but are not limited to the risk of perforation, infection, bleeding, missed lesions, lack of diagnosis, severe illness requiring hospitalization, as well as anesthesia and sedation related illnesses.  The patient is agreeable to proceed.  All patient questions were answered to the best of my ability, and the patient agrees to the aforementioned plan of action with follow-up as indicated.   PLAN  Proceed to scheduling diagnostic endoscopy (esophageal biopsies at minimum and empiric dilation) Recommend LFTs to be drawn at least twice daily for monitoring in setting of possible hepatic steatosis Consider in future abdominal ultrasound to evaluate liver and spleen although in setting of normalized LFTs within the last 3 month may not need to do for a while Consideration of esophageal manometry and pH impedance testing potentially in the future depending on findings on endoscopy   Orders Placed This Encounter  Procedures  . Ambulatory referral to Gastroenterology    New Prescriptions   No medications on file   Modified Medications   No medications on file    Planned Follow Up No follow-ups on file.   Total Time in Face-to-Face and in Coordination of Care for patient including independent/personal  interpretation/review of prior testing, medical history, examination, medication adjustment, communicating results with the patient directly, and documentation with the EHR is 45 minutes.   Corliss ParishGabriel Mansouraty, MD [facility] Office # [phone]

## 2020-09-19 ENCOUNTER — Encounter: Payer: Self-pay | Admitting: Family Medicine

## 2020-09-19 ENCOUNTER — Other Ambulatory Visit: Payer: Self-pay

## 2020-09-19 ENCOUNTER — Ambulatory Visit: Payer: 59 | Admitting: Family Medicine

## 2020-09-19 VITALS — BP 126/80 | HR 60 | Temp 98.6°F | Ht 69.0 in | Wt 285.0 lb

## 2020-09-19 DIAGNOSIS — G4730 Sleep apnea, unspecified: Secondary | ICD-10-CM | POA: Insufficient documentation

## 2020-09-19 DIAGNOSIS — R142 Eructation: Secondary | ICD-10-CM

## 2020-09-19 DIAGNOSIS — R0789 Other chest pain: Secondary | ICD-10-CM | POA: Diagnosis not present

## 2020-09-19 DIAGNOSIS — T7840XA Allergy, unspecified, initial encounter: Secondary | ICD-10-CM | POA: Insufficient documentation

## 2020-09-19 DIAGNOSIS — K219 Gastro-esophageal reflux disease without esophagitis: Secondary | ICD-10-CM | POA: Insufficient documentation

## 2020-09-19 DIAGNOSIS — J45909 Unspecified asthma, uncomplicated: Secondary | ICD-10-CM | POA: Insufficient documentation

## 2020-09-19 DIAGNOSIS — I10 Essential (primary) hypertension: Secondary | ICD-10-CM | POA: Insufficient documentation

## 2020-09-19 NOTE — Patient Instructions (Signed)

## 2020-09-19 NOTE — Progress Notes (Signed)
This visit occurred during the SARS-CoV-2 public health emergency.  Safety protocols were in place, including screening questions prior to the visit, additional usage of staff PPE, and extensive cleaning of exam room while observing appropriate contact time as indicated for disinfecting solutions.    Christian Escobar , 12-27-1975, 44 y.o., male MRN: [id number] Patient Care Team    Relationship Specialty Notifications Start End  Natalia Leatherwood, DO PCP - General Family Medicine  09/01/16   Orvilla Fus., MD Referring Physician Cardiology  12/01/19     Chief Complaint  Patient presents with  . Chest Pain    pt c/o of chest pressure x 5 mos; pt states it has unchanged; pt has an EGD sched and was instructed to follow with CT but would like to have CT; pt is also requesting labs     Subjective: Pt presents for an OV with complaints of continued chest pressure and gassy feeling with increased belching.  Patient started CPAP shortly prior to onset of all of his symptoms.  He has been evaluated by ENT and gastroenterology concerning his symptoms.  Gastroenterology plans to perform an EGD to further evaluate for GI etiology of symptoms.  CT chest has been reassuring.  Patient reports he tried taking Gas-X which was unhelpful.  He states he is fine when he has his CPAP on but as soon as he wakes up and takes his CPAP off he immediately starts to feel gassy and starts to belch.  He reports the pain feels like he has a "bubble "in his upper abdominal area and can be on the left side of his abdomen at times as well.  Depression screen [facility] 2/9 09/19/2020 11/18/2019 09/29/2016 09/01/2016  Decreased Interest 0 0 0 0  Down, Depressed, Hopeless 0 0 0 0  PHQ - 2 Score 0 0 0 0    Allergies  Allergen Reactions  . Lipitor [Atorvastatin] Other (See Comments)    Elevated LFTs, elevated CK with atorvastatin  . Lisinopril Cough   Social History   Social History Narrative   Married to  [place]. No children.    BS. Div. Chief Financial Officer.    Drinks caffeine.    Wears seatbelt, bicycle helmet. Smoke detector in the home.    Exercises 3x a week.    Firearms locked in the home.    Feels safe in his relationships.    Past Medical History:  Diagnosis Date  . Allergy   . Asthma   . COVID-19 09/2019  . Elevated CK 12/02/2019  . Elevated LFTs 12/02/2019  . GERD (gastroesophageal reflux disease)   . Hypertension   . Sleep apnea with use of continuous positive airway pressure (CPAP)    Past Surgical History:  Procedure Laterality Date  . NO PAST SURGERIES     Family History  Problem Relation Age of Onset  . Hypertension Father   . Arthritis Father   . GER disease Father   . Colon cancer Neg Hx   . Esophageal cancer Neg Hx   . Inflammatory bowel disease Neg Hx   . Liver disease Neg Hx   . Pancreatic cancer Neg Hx   . Rectal cancer Neg Hx   . Stomach cancer Neg Hx    Allergies as of 09/19/2020      Reactions   Lipitor [atorvastatin] Other (See Comments)   Elevated LFTs, elevated CK with atorvastatin   Lisinopril Cough      Medication List  Accurate as of September 19, 2020  5:41 PM. If you have any questions, ask your nurse or doctor.        Aspirin Adult Low Strength 81 MG EC tablet Generic drug: aspirin Take 1 tablet (81 mg total) by mouth daily.   colesevelam 625 MG tablet Commonly known as: Welchol Take 2 tablets (1,250 mg total) by mouth 2 (two) times daily with a meal.   famotidine 20 MG tablet Commonly known as: PEPCID Take 1 tablet (20 mg total) by mouth 2 (two) times daily.   fluticasone 50 MCG/ACT nasal spray Commonly known as: FLONASE Place 1 spray into both nostrils at bedtime.   hydrochlorothiazide 25 MG tablet Commonly known as: HYDRODIURIL Take 1 tablet (25 mg total) by mouth daily.   isosorbide mononitrate 30 MG 24 hr tablet Commonly known as: IMDUR Take 1 tablet (30 mg total) by mouth daily.   losartan 50 MG  tablet Commonly known as: COZAAR Take 1 tablet (50 mg total) by mouth daily.   metoprolol tartrate 25 MG tablet Commonly known as: LOPRESSOR Take 1 tablet (25 mg total) by mouth 2 (two) times daily.   omeprazole 40 MG capsule Commonly known as: PRILOSEC Take 1 capsule (40 mg total) by mouth daily.   ondansetron 8 MG disintegrating tablet Commonly known as: ZOFRAN-ODT Take by mouth.       All past medical history, surgical history, allergies, family history, immunizations andmedications were updated in the EMR today and reviewed under the history and medication portions of their EMR.     ROS: Negative, with the exception of above mentioned in HPI   Objective:  BP 126/80   Pulse 60   Temp 98.6 F (37 C) (Oral)   Ht 5\' 9"  (1.753 m)   Wt 285 lb (129.3 kg)   SpO2 98%   BMI 42.09 kg/m  Body mass index is 42.09 kg/m. Gen: Afebrile. No acute distress. Nontoxic in appearance, well developed, well nourished.  HENT: AT. [place].  Eyes:Pupils Equal Round Reactive to light, Extraocular movements intact,  Conjunctiva without redness, discharge or icterus. Neuro: Normal gait.  Alert. Oriented x3  Psych: Normal affect, dress and demeanor. Normal speech. Normal thought content and judgment.  No exam data present No results found. No results found for this or any previous visit (from the past 24 hour(s)).  Assessment/Plan: Christian Escobar is a 44 y.o. male present for OV for  Chest discomfort/Belching Reviewed CT chest with him in person today.  CT chest visualized down to upper pole of kidneys without acute concern.  He did have some gassiness within his colon and stomach at that time. He has had ENT evaluation which ruled out any ENT type etiology. He has had gastroenterology evaluation-and they plan to do an EGD in the near future.   He has been placed back on his GERD regimen-without improvement in his symptoms. He had been reevaluated by his pulmonologist who did not feel his  symptoms were related to his start of CPAP.  Discussed with him today that if his EGD is normal, I would suspect that his symptoms are coming from the CPAP and pressure settings.  Possibly, secondary to his changes in position when sleeping.  Onset of symptoms  started right after CPAP was initiated and further work-up for other etiologies has been unfruitful today.  If EGD does not show evidence of cause of his symptoms I would encourage him to reach out to his pulmonologist again and try to work with them  for solution.   Reviewed expectations re: course of current medical issues.  Discussed self-management of symptoms.  Outlined signs and symptoms indicating need for more acute intervention.  Patient verbalized understanding and all questions were answered.  Patient received an After-Visit Summary.    No orders of the defined types were placed in this encounter.  No orders of the defined types were placed in this encounter.  Referral Orders  No referral(s) requested today     Note is dictated utilizing voice recognition software. Although note has been proof read prior to signing, occasional typographical errors still can be missed. If any questions arise, please do not hesitate to call for verification.   electronically signed by:  Felix Pacini, DO  [facility] [place]

## 2020-09-21 ENCOUNTER — Encounter: Payer: Self-pay | Admitting: Cardiology

## 2020-09-21 ENCOUNTER — Other Ambulatory Visit: Payer: Self-pay

## 2020-09-21 ENCOUNTER — Ambulatory Visit: Payer: 59 | Admitting: Cardiology

## 2020-09-21 VITALS — BP 130/72 | HR 66 | Ht 69.0 in | Wt 280.0 lb

## 2020-09-21 DIAGNOSIS — E785 Hyperlipidemia, unspecified: Secondary | ICD-10-CM

## 2020-09-21 DIAGNOSIS — R7303 Prediabetes: Secondary | ICD-10-CM

## 2020-09-21 DIAGNOSIS — G4733 Obstructive sleep apnea (adult) (pediatric): Secondary | ICD-10-CM

## 2020-09-21 DIAGNOSIS — I10 Essential (primary) hypertension: Secondary | ICD-10-CM | POA: Diagnosis not present

## 2020-09-21 DIAGNOSIS — R0602 Shortness of breath: Secondary | ICD-10-CM

## 2020-09-21 DIAGNOSIS — Z9989 Dependence on other enabling machines and devices: Secondary | ICD-10-CM

## 2020-09-21 NOTE — Progress Notes (Signed)
Cardiology Office Note:    Date:  09/21/2020   ID:  Christian Escobar, DOB 02/17/1976, MRN [id number]  PCP:  Natalia Leatherwood, DO  Cardiologist:  Gypsy Balsam, MD    Referring MD: Natalia Leatherwood, DO   Chief Complaint  Patient presents with  . Follow-up  I am doing very well  History of Present Illness:    Christian Escobar is a 44 y.o. male who was referred to [place] initially because of atypical chest pain as well as shortness of breath that he developed after COVID-19 infection.  Testing were done including calcium score which was 0 and he has been doing well since that time the fact that he got Covid change his approach to his life he started taking care of himself he started being more active he also changed his diet significantly he lost significant amount of weight since I seen him last time, I congratulated him for it.  He denies have any chest pain tightness squeezing pressure burning chest.  Past Medical History:  Diagnosis Date  . Allergy   . Asthma   . COVID-19 09/2019  . Elevated CK 12/02/2019  . Elevated LFTs 12/02/2019  . GERD (gastroesophageal reflux disease)   . Hypertension   . Sleep apnea with use of continuous positive airway pressure (CPAP)     Past Surgical History:  Procedure Laterality Date  . NO PAST SURGERIES      Current Medications: Current Meds  Medication Sig  . ASPIRIN ADULT LOW STRENGTH 81 MG EC tablet Take 1 tablet (81 mg total) by mouth daily.  . colesevelam (WELCHOL) 625 MG tablet Take 2 tablets (1,250 mg total) by mouth 2 (two) times daily with a meal.  . famotidine (PEPCID) 20 MG tablet Take 1 tablet (20 mg total) by mouth 2 (two) times daily.  . fluticasone (FLONASE) 50 MCG/ACT nasal spray Place 1 spray into both nostrils at bedtime.  . hydrochlorothiazide (HYDRODIURIL) 25 MG tablet Take 1 tablet (25 mg total) by mouth daily.  . isosorbide mononitrate (IMDUR) 30 MG 24 hr tablet Take 1 tablet (30 mg total) by mouth daily.  Marland Kitchen losartan (COZAAR) 50  MG tablet Take 1 tablet (50 mg total) by mouth daily.  . metoprolol tartrate (LOPRESSOR) 25 MG tablet Take 1 tablet (25 mg total) by mouth 2 (two) times daily.  Marland Kitchen omeprazole (PRILOSEC) 40 MG capsule Take 1 capsule (40 mg total) by mouth daily.     Allergies:   Lipitor [atorvastatin] and Lisinopril   Social History   Socioeconomic History  . Marital status: Married    Spouse name: Natalia Leatherwood  . Number of children: 0  . Years of education: 73  . Highest education level: Not on file  Occupational History  . Occupation: Administrator, sports  Tobacco Use  . Smoking status: Never Smoker  . Smokeless tobacco: Never Used  Vaping Use  . Vaping Use: Never used  Substance and Sexual Activity  . Alcohol use: No  . Drug use: No  . Sexual activity: Yes    Partners: Female    Comment: married  Other Topics Concern  . Not on file  Social History Narrative   Married to Christian Escobar. No children.    BS. Div. Chief Financial Officer.    Drinks caffeine.    Wears seatbelt, bicycle helmet. Smoke detector in the home.    Exercises 3x a week.    Firearms locked in the home.    Feels safe in his relationships.  Social Determinants of Health   Financial Resource Strain:   . Difficulty of Paying Living Expenses: Not on file  Food Insecurity:   . Worried About Programme researcher, broadcasting/film/video in the Last Year: Not on file  . Ran Out of Food in the Last Year: Not on file  Transportation Needs:   . Lack of Transportation (Medical): Not on file  . Lack of Transportation (Non-Medical): Not on file  Physical Activity:   . Days of Exercise per Week: Not on file  . Minutes of Exercise per Session: Not on file  Stress:   . Feeling of Stress : Not on file  Social Connections:   . Frequency of Communication with Friends and Family: Not on file  . Frequency of Social Gatherings with Friends and Family: Not on file  . Attends Religious Services: Not on file  . Active Member of Clubs or Organizations: Not on file    . Attends Banker Meetings: Not on file  . Marital Status: Not on file     Family History: The patient's family history includes Arthritis in his father; GER disease in his father; Hypertension in his father. There is no history of Colon cancer, Esophageal cancer, Inflammatory bowel disease, Liver disease, Pancreatic cancer, Rectal cancer, or Stomach cancer. ROS:   Please see the history of present illness.    All 14 point review of systems negative except as described per history of present illness  EKGs/Labs/Other Studies Reviewed:      Recent Labs: 11/18/2019: Hemoglobin 15.5; Platelets 294.0; TSH 2.52 07/02/2020: ALT 49; BUN 16; Creat 1.07; Potassium 4.1; Sodium 139  Recent Lipid Panel    Component Value Date/Time   CHOL 172 05/08/2020 1202   TRIG 119.0 05/08/2020 1202   HDL 35.10 (L) 05/08/2020 1202   CHOLHDL 5 05/08/2020 1202   VLDL 23.8 05/08/2020 1202   LDLCALC 114 (H) 05/08/2020 1202    Physical Exam:    VS:  BP 130/72 (BP Location: Left Arm, Patient Position: Sitting)   Pulse 66   Ht 5\' 9"  (1.753 m)   Wt 280 lb (127 kg)   SpO2 97%   BMI 41.35 kg/m     Wt Readings from Last 3 Encounters:  09/21/20 280 lb (127 kg)  09/19/20 285 lb (129.3 kg)  09/07/20 286 lb 8 oz (130 kg)     GEN:  Well nourished, well developed in no acute distress HEENT: Normal NECK: No JVD; No carotid bruits LYMPHATICS: No lymphadenopathy CARDIAC: RRR, no murmurs, no rubs, no gallops RESPIRATORY:  Clear to auscultation without rales, wheezing or rhonchi  ABDOMEN: Soft, non-tender, non-distended MUSCULOSKELETAL:  No edema; No deformity  SKIN: Warm and dry LOWER EXTREMITIES: no swelling NEUROLOGIC:  Alert and oriented x 3 PSYCHIATRIC:  Normal affect   ASSESSMENT:    1. Essential hypertension, benign   2. OSA on CPAP   3. Prediabetes   4. Severe obesity (BMI >= 40) (HCC)   5. Shortness of breath   6. Dyslipidemia    PLAN:    In order of problems listed  above:  1. Essential hypertension blood pressure well controlled continue present management. 2. Obstructive sleep apnea: Using CPAP on the regular basis. 3. Prediabetes I did review his K PN from January his hemoglobin A1c was 6.2 now he make significant changes needs diet as well as exercises that should improve it. 4. Obesity: He is losing weight and we did discuss technique that he can use to make it even  better. 5. Shortness of breath he started walking on the regular basis doing well. 6. Dyslipidemia I did review his K PN LDL was 114 HDL 35 this is from 05/08/2020.  Previously we calculate his 10 years risk however his calcium score was 0 he was in the intermediate risk therefore at risk factors modifications are recommended that he is already doing.  Overall I am very pleased with his progress.  We will see him back in 6 months   Medication Adjustments/Labs and Tests Ordered: Current medicines are reviewed at length with the patient today.  Concerns regarding medicines are outlined above.  No orders of the defined types were placed in this encounter.  Medication changes: No orders of the defined types were placed in this encounter.   Signed, Georgeanna Lea, MD, [facility] 09/21/2020 11:21 AM    [facility]

## 2020-09-21 NOTE — Patient Instructions (Signed)

## 2020-09-25 ENCOUNTER — Ambulatory Visit: Payer: 59 | Admitting: Pulmonary Disease

## 2020-10-09 ENCOUNTER — Encounter: Payer: Self-pay | Admitting: Gastroenterology

## 2020-10-16 ENCOUNTER — Encounter: Payer: Self-pay | Admitting: Gastroenterology

## 2020-10-16 ENCOUNTER — Ambulatory Visit (AMBULATORY_SURGERY_CENTER): Payer: 59 | Admitting: Gastroenterology

## 2020-10-16 ENCOUNTER — Other Ambulatory Visit: Payer: Self-pay

## 2020-10-16 VITALS — BP 146/94 | HR 68 | Temp 97.3°F | Resp 12 | Ht 68.0 in | Wt 286.0 lb

## 2020-10-16 DIAGNOSIS — K3189 Other diseases of stomach and duodenum: Secondary | ICD-10-CM

## 2020-10-16 DIAGNOSIS — K2900 Acute gastritis without bleeding: Secondary | ICD-10-CM

## 2020-10-16 DIAGNOSIS — R0789 Other chest pain: Secondary | ICD-10-CM

## 2020-10-16 DIAGNOSIS — K2970 Gastritis, unspecified, without bleeding: Secondary | ICD-10-CM

## 2020-10-16 DIAGNOSIS — R1310 Dysphagia, unspecified: Secondary | ICD-10-CM | POA: Diagnosis present

## 2020-10-16 MED ORDER — OMEPRAZOLE 40 MG PO CPDR: 40.0000 mg | DELAYED_RELEASE_CAPSULE | Freq: Two times a day (BID) | ORAL | 3 refills | Status: DC

## 2020-10-16 MED ORDER — SODIUM CHLORIDE 0.9 % IV SOLN: 500.0000 mL | Freq: Once | INTRAVENOUS | Status: DC

## 2020-10-16 NOTE — Progress Notes (Signed)
Patient consents to observer being present for procedure. Patient consents to observer being present for procedure.   Volanda Napoleon, medical student

## 2020-10-16 NOTE — Patient Instructions (Signed)
Increase omeprazole to 40mg  twice daily , (at least 30 min before meals and only with water) for the next 2 months Continue present medications Await pathology results  YOU HAD AN ENDOSCOPIC PROCEDURE TODAY AT THE [facility]:   Refer to the procedure report that was given to you for any specific questions about what was found during the examination.  If the procedure report does not answer your questions, please call your gastroenterologist to clarify.  If you requested that your care partner not be given the details of your procedure findings, then the procedure report has been included in a sealed envelope for you to review at your convenience later.  YOU SHOULD EXPECT: Some feelings of bloating in the abdomen. Passage of more gas than usual.  Walking can help get rid of the air that was put into your GI tract during the procedure and reduce the bloating. If you had a lower endoscopy (such as a colonoscopy or flexible sigmoidoscopy) you may notice spotting of blood in your stool or on the toilet paper. If you underwent a bowel prep for your procedure, you may not have a normal bowel movement for a few days.  Please Note:  You might notice some irritation and congestion in your nose or some drainage.  This is from the oxygen used during your procedure.  There is no need for concern and it should clear up in a day or so.  SYMPTOMS TO REPORT IMMEDIATELY:   Following upper endoscopy (EGD)  Vomiting of blood or coffee ground material  New chest pain or pain under the shoulder blades  Painful or persistently difficult swallowing  New shortness of breath  Fever of 100F or higher  Black, tarry-looking stools  For urgent or emergent issues, a gastroenterologist can be reached at any hour by calling [phone]. Do not use MyChart messaging for urgent concerns.   DIET:  We do recommend a small meal at first, but then you may proceed to your regular diet.  Drink plenty of fluids but  you should avoid alcoholic beverages for 24 hours.  ACTIVITY:  You should plan to take it easy for the rest of today and you should NOT DRIVE or use heavy machinery until tomorrow (because of the sedation medicines used during the test).    FOLLOW UP: Our staff will call the number listed on your records 48-72 hours following your procedure to check on you and address any questions or concerns that you may have regarding the information given to you following your procedure. If we do not reach you, we will leave a message.  We will attempt to reach you two times.  During this call, we will ask if you have developed any symptoms of COVID 19. If you develop any symptoms (ie: fever, flu-like symptoms, shortness of breath, cough etc.) before then, please call [phone].  If you test positive for Covid 19 in the 2 weeks post procedure, please call and report this information to [phone].    If any biopsies were taken you will be contacted by phone or by letter within the next 1-3 weeks.  Please call us at [phone] if you have not heard about the biopsies in 3 weeks.   SIGNATURES/CONFIDENTIALITY: You and/or your care partner have signed paperwork which will be entered into your electronic medical record.  These signatures attest to the fact that that the information above on your After Visit Summary has been reviewed and is understood.  Full responsibility  of the confidentiality of this discharge information lies with you and/or your care-partner.

## 2020-10-16 NOTE — Progress Notes (Signed)
Called to room to assist during endoscopic procedure.  Patient ID and intended procedure confirmed with present staff. Received instructions for my participation in the procedure from the performing physician.  

## 2020-10-16 NOTE — Op Note (Signed)
[place] Patient Name: Christian Escobar Procedure Date: 10/16/2020 7:51 AM MRN: [id number] Endoscopist: Justice Britain , MD Age: 44 Referring MD:  Date of Birth: Mar 02, 1976 Gender: Male Account #: [id number] Procedure:                Upper GI endoscopy Indications:              Abdominal pain in the left upper quadrant and left                            flank (not prandial related), Dysphagia,                            Unexplained chest pain Medicines:                Monitored Anesthesia Care Procedure:                Pre-Anesthesia Assessment:                           - Prior to the procedure, a History and Physical                            was performed, and patient medications and                            allergies were reviewed. The patient's tolerance of                            previous anesthesia was also reviewed. The risks                            and benefits of the procedure and the sedation                            options and risks were discussed with the patient.                            All questions were answered, and informed consent                            was obtained. Prior Anticoagulants: The patient has                            taken no previous anticoagulant or antiplatelet                            agents except for aspirin. ASA Grade Assessment: II                            - A patient with mild systemic disease. After                            reviewing the risks and benefits, the patient was  deemed in satisfactory condition to undergo the                            procedure.                           After obtaining informed consent, the endoscope was                            passed under direct vision. Throughout the                            procedure, the patient's blood pressure, pulse, and                            oxygen saturations were monitored continuously. The                             Endoscope was introduced through the mouth, and                            advanced to the fourth part of duodenum. The upper                            GI endoscopy was accomplished without difficulty.                            The patient tolerated the procedure. Scope In: Scope Out: Findings:                 No gross lesions were noted in the entire                            esophagus. Biopsies were taken with a cold forceps                            for histology for EoE/LoE rule out. After the                            completion of the EGD, a guidewire was placed and                            the scope was withdrawn. Dilation was performed                            with a Savary dilator with no resistance at 18 mm.                            The dilation site was examined following endoscope                            reinsertion and showed no change.  The Z-line was irregular and was found 41 cm from                            the incisors.                           Diffuse moderately erythematous mucosa without                            bleeding was found in the entire examined stomach.                           No other gross lesions were noted in the entire                            examined stomach. Biopsies were taken with a cold                            forceps for histology and Helicobacter pylori                            testing.                           No gross lesions were noted in the duodenal bulb,                            in the first portion of the duodenum and in the                            second portion of the duodenum. Complications:            No immediate complications. Estimated Blood Loss:     Estimated blood loss was minimal. Impression:               - No gross lesions in esophagus. Biopsied. Dilated.                           - Z-line irregular, 41 cm from the incisors.                           -  Erythematous mucosa in the stomach. No other                            gross lesions in the stomach. Biopsied.                           - No gross lesions in the duodenal bulb, in the                            first portion of the duodenum and in the second                            portion of the duodenum. Recommendation:           -  The patient will be observed post-procedure,                            until all discharge criteria are met.                           - Discharge patient to home.                           - Dilation diet as per protocol.                           - Increase Omeprazole to 40 mg twice daily for next                            34-month.                           - Consider Carafate use in future.                           - Continue present medications.                           - If issues persist after biopsies have returned,                            then consider a CT-Abdomen/Pelvis to better explore                            patient's LUQ/Left flank pain (though less likely                            to be GI related) to help exclude other etiologies                            of issues.                           - Await pathology results.                           - The findings and recommendations were discussed                            with the patient.                           - The findings and recommendations were discussed                            with the patient's family. GJustice Britain MD 10/16/2020 8:25:58 AM

## 2020-10-16 NOTE — Progress Notes (Signed)
[facility]  [facility] consents to observer being present for procedure.

## 2020-10-16 NOTE — Progress Notes (Signed)
pt tolerated well. VSS. awake and to recovery. Report given to RN.  

## 2020-10-17 ENCOUNTER — Telehealth: Payer: Self-pay | Admitting: *Deleted

## 2020-10-17 NOTE — Telephone Encounter (Signed)
  Follow up Call-  Call back number 10/16/2020  Post procedure Call Back phone  # [phone]  Permission to leave phone message Yes  Some recent data might be hidden     Patient questions:  Do you have a fever, pain , or abdominal swelling? No. Pain Score  0 *  Have you tolerated food without any problems? Yes.    Have you been able to return to your normal activities? Yes.    Do you have any questions about your discharge instructions: Diet   No. Medications  No. Follow up visit  No.  Do you have questions or concerns about your Care? No.  Actions: * If pain score is 4 or above: No action needed, pain <4  1. Have you developed a fever since your procedure? NO  2.   Have you had an respiratory symptoms (SOB or cough) since your procedure? NO  3.   Have you tested positive for COVID 19 since your procedure NO  4.   Have you had any family members/close contacts diagnosed with the COVID 19 since your procedure?  NO   If yes to any of these questions please route to Laverna Peace, RN and Karlton Lemon, RN

## 2020-10-26 ENCOUNTER — Ambulatory Visit: Payer: 59 | Admitting: Pulmonary Disease

## 2020-10-28 ENCOUNTER — Encounter: Payer: Self-pay | Admitting: Gastroenterology

## 2020-10-29 ENCOUNTER — Other Ambulatory Visit: Payer: Self-pay

## 2020-10-29 DIAGNOSIS — A048 Other specified bacterial intestinal infections: Secondary | ICD-10-CM

## 2020-10-29 MED ORDER — CLARITHROMYCIN 500 MG PO TABS: 500.0000 mg | ORAL_TABLET | Freq: Two times a day (BID) | ORAL | 0 refills | Status: AC

## 2020-10-29 MED ORDER — METRONIDAZOLE 500 MG PO TABS: 250.0000 mg | ORAL_TABLET | Freq: Two times a day (BID) | ORAL | 0 refills | Status: AC

## 2020-10-29 MED ORDER — AMOXICILLIN 500 MG PO TABS: 1000.0000 mg | ORAL_TABLET | Freq: Two times a day (BID) | ORAL | 0 refills | Status: AC

## 2020-11-03 ENCOUNTER — Other Ambulatory Visit: Payer: Self-pay | Admitting: Family Medicine

## 2020-11-03 DIAGNOSIS — R000 Tachycardia, unspecified: Secondary | ICD-10-CM

## 2020-11-03 DIAGNOSIS — I10 Essential (primary) hypertension: Secondary | ICD-10-CM

## 2020-11-07 ENCOUNTER — Other Ambulatory Visit: Payer: Self-pay

## 2020-11-07 MED ORDER — COLESEVELAM HCL 625 MG PO TABS: 1250.0000 mg | ORAL_TABLET | Freq: Two times a day (BID) | ORAL | 0 refills | Status: DC

## 2020-11-15 ENCOUNTER — Ambulatory Visit: Payer: 59 | Admitting: Pulmonary Disease

## 2020-11-16 ENCOUNTER — Ambulatory Visit: Payer: 59 | Admitting: Pulmonary Disease

## 2020-11-16 ENCOUNTER — Encounter: Payer: Self-pay | Admitting: Pulmonary Disease

## 2020-11-16 ENCOUNTER — Other Ambulatory Visit: Payer: Self-pay

## 2020-11-16 VITALS — BP 130/72 | HR 84 | Temp 97.3°F | Ht 68.0 in | Wt 286.6 lb

## 2020-11-16 DIAGNOSIS — G4733 Obstructive sleep apnea (adult) (pediatric): Secondary | ICD-10-CM | POA: Diagnosis not present

## 2020-11-16 DIAGNOSIS — Z9989 Dependence on other enabling machines and devices: Secondary | ICD-10-CM | POA: Diagnosis not present

## 2020-11-16 NOTE — Progress Notes (Signed)
Subjective:    Patient ID: Christian Escobar, male    DOB: 08/28/1976, 45 y.o.   MRN: [id number]  Recently diagnosed with severe obstructive sleep apnea has been using CPAP regularly  A few weeks ago started having sensation of chest pressure about 15 to 20 minutes after getting off his CPAP Evaluated by GI and ENT-no significant finding  This may likely be related to too high pressures from his CPAP, though is not having significant bloating  Otherwise, his health has been okay Still gets very restorative sleep No longer having significant headaches   Has been using CPAP pressure of 17 Gets about 6-1/2 hours to 7 hours on CPAP  Continues to lose weight Significant weight gain from about age 53 Weight about 180 when he was [age]  Has history of high blood pressure, asthma, hypercholesterolemia  Usually goes to bed between 8 and 10 PM, takes him about 30 minutes to fall asleep Wakes up about 2-3 times during the night Final awakening time about 6 AM  Before his diagnosis- Admits to dryness of his mouth in the mornings Admits to gasping respirations at night His memory is good Denies sleepy driving  Dad did have obstructive sleep apnea, was able to discontinue CPAP use with weight loss  Past Medical History:  Diagnosis Date  . Allergy   . Asthma   . COVID-19 09/2019  . Elevated CK 12/02/2019  . Elevated LFTs 12/02/2019  . GERD (gastroesophageal reflux disease)   . Hyperlipidemia   . Hypertension   . Sleep apnea    wears CPAP  . Sleep apnea with use of continuous positive airway pressure (CPAP)    Social History   Socioeconomic History  . Marital status: Married    Spouse name: Natalia Leatherwood  . Number of children: 0  . Years of education: 65  . Highest education level: Not on file  Occupational History  . Occupation: Administrator, sports  Tobacco Use  . Smoking status: Never Smoker  . Smokeless tobacco: Never Used  Vaping Use  . Vaping Use: Never used   Substance and Sexual Activity  . Alcohol use: No  . Drug use: No  . Sexual activity: Yes    Partners: Female    Comment: married  Other Topics Concern  . Not on file  Social History Narrative   Married to Neal. No children.    BS. Div. Chief Financial Officer.    Drinks caffeine.    Wears seatbelt, bicycle helmet. Smoke detector in the home.    Exercises 3x a week.    Firearms locked in the home.    Feels safe in his relationships.    Social Determinants of Health   Financial Resource Strain: Not on file  Food Insecurity: Not on file  Transportation Needs: Not on file  Physical Activity: Not on file  Stress: Not on file  Social Connections: Not on file  Intimate Partner Violence: Not on file   Family History  Problem Relation Age of Onset  . Hypertension Father   . Arthritis Father   . GER disease Father   . Colon cancer Neg Hx   . Esophageal cancer Neg Hx   . Inflammatory bowel disease Neg Hx   . Liver disease Neg Hx   . Pancreatic cancer Neg Hx   . Rectal cancer Neg Hx   . Stomach cancer Neg Hx    Review of Systems  Constitutional: Positive for unexpected weight change.  Respiratory: Positive for apnea.  Cardiovascular: Negative for chest pain.  Psychiatric/Behavioral: Positive for sleep disturbance.  All other systems reviewed and are negative.     Objective:   Physical Exam Constitutional:      Appearance: He is obese.  HENT:     Head: Normocephalic.     Mouth/Throat:     Comments: Crowded, Mallampati 2 Neck:     Comments: Neck size 18 Cardiovascular:     Pulses: Normal pulses.     Heart sounds: Normal heart sounds. No murmur heard.   Pulmonary:     Effort: Pulmonary effort is normal. No respiratory distress.     Breath sounds: Normal breath sounds. No stridor. No wheezing or rhonchi.  Musculoskeletal:     Cervical back: No rigidity.  Neurological:     Mental Status: He is alert.  Psychiatric:        Mood and Affect: Mood normal.     Vitals:   11/16/20 1640  BP: 130/72  Pulse: 84  Temp: (!) 97.3 F (36.3 C)  SpO2: 97%   Results of the Epworth flowsheet 12/07/2019  Sitting and reading 2  Watching TV 2  Sitting, inactive in a public place (e.g. a theatre or a meeting) 0  As a passenger in a car for an hour without a break 0  Lying down to rest in the afternoon when circumstances permit 2  Sitting and talking to someone 0  Sitting quietly after a lunch without alcohol 1  In a car, while stopped for a few minutes in traffic 0  Total score 7     CPAP compliance: Download from the machine does reveal excellent compliance average use of 7 hours Residual AHI of 1.1 but with significant leaks  Assessment & Plan:   Severe obstructive sleep apnea -Patient tolerating CPAP of 17 for will but lately has been having issues with chest discomfort which may be related to high pressures -Continues to use CPAP on a regular basis and is benefiting from CPAP use  Morbid obesity  Pathophysiology of sleep disordered breathing discussed with the patient   Plan: Continue CPAP therapy -We will decrease the pressure from 17-14 and keep an eye on his AHI  I will see him back in about 3 months  We will try and obtain a download from his machine in about a month  Encouraged to call with any significant concerns

## 2020-11-16 NOTE — Patient Instructions (Signed)
We will reduce the pressure from 17-14  Call us if you feel the pressure is inadequate or you are not able to rest well with it  We will get a download from your machine in about a month to see that it still treating the sleep apnea will  Will see you in 3 months

## 2020-12-02 ENCOUNTER — Other Ambulatory Visit: Payer: Self-pay | Admitting: Family Medicine

## 2020-12-02 DIAGNOSIS — R000 Tachycardia, unspecified: Secondary | ICD-10-CM

## 2020-12-02 DIAGNOSIS — I10 Essential (primary) hypertension: Secondary | ICD-10-CM

## 2020-12-14 ENCOUNTER — Ambulatory Visit: Payer: 59 | Admitting: Gastroenterology

## 2020-12-14 ENCOUNTER — Encounter: Payer: Self-pay | Admitting: Gastroenterology

## 2020-12-14 VITALS — Ht 68.0 in | Wt 285.6 lb

## 2020-12-14 DIAGNOSIS — R109 Unspecified abdominal pain: Secondary | ICD-10-CM

## 2020-12-14 DIAGNOSIS — K219 Gastro-esophageal reflux disease without esophagitis: Secondary | ICD-10-CM | POA: Diagnosis not present

## 2020-12-14 DIAGNOSIS — Z8619 Personal history of other infectious and parasitic diseases: Secondary | ICD-10-CM | POA: Diagnosis not present

## 2020-12-14 DIAGNOSIS — R1012 Left upper quadrant pain: Secondary | ICD-10-CM

## 2020-12-14 DIAGNOSIS — K760 Fatty (change of) liver, not elsewhere classified: Secondary | ICD-10-CM

## 2020-12-14 NOTE — Patient Instructions (Addendum)
You have been scheduled for a CT scan of the abdomen and pelvis at [facility] ([address]---this is in the same building as [employer]).   You are scheduled on 12/19/20 at 11:00am. You should arrive 15 minutes prior to your appointment time for registration. Please follow the written instructions below on the day of your exam:  WARNING: IF YOU ARE ALLERGIC TO IODINE/X-RAY DYE, PLEASE NOTIFY RADIOLOGY IMMEDIATELY AT [phone]! YOU WILL BE GIVEN A 13 HOUR PREMEDICATION PREP.  1) Do not eat or drink anything after 7:00am (4 hours prior to your test) 2) You have been given 1  bottles of oral contrast to drink. The solution may taste better if refrigerated, but do NOT add ice or any other liquid to this solution. Shake well before drinking.    Drink 1 bottle of contrast @ 10:00am (1 hour prior to your exam)  You may take any medications as prescribed with a small amount of water, if necessary. If you take any of the following medications: METFORMIN, GLUCOPHAGE, GLUCOVANCE, AVANDAMET, RIOMET, FORTAMET, [place] MET, JANUMET, GLUMETZA or METAGLIP, you MAY be asked to HOLD this medication 48 hours AFTER the exam.  The purpose of you drinking the oral contrast is to aid in the visualization of your intestinal tract. The contrast solution may cause some diarrhea. Depending on your individual set of symptoms, you may also receive an intravenous injection of x-ray contrast/dye. Plan on being at [facility] for 30 minutes or longer, depending on the type of exam you are having performed.  This test typically takes 30-45 minutes to complete.  If you have any questions regarding your exam or if you need to reschedule, you may call the CT department at [phone] between the hours of 8:00 am and 5:00 pm, Monday-Friday.  _______________________________________________________________________  START: Decrease your  Omeprazole 87m  To  :Once daily for through 12/21/20, then  start taking it every other day  Until 01/04/21, then stop.   Give stool sample 01/21/2021. After giving stool sample start back on Omeprazole 470m once daily.     If you are age 4943r younger, your body mass index should be between 19-25. Your Body mass index is 43.43 kg/m. If this is out of the aformentioned range listed, please consider follow up with your Primary Care Provider.   Keep follow -up appointment on 02/22/21 @ 8:30am with Dr. MaRush Landmark Thank you for choosing me and [facility].  Dr. MaRush Landmark

## 2020-12-14 NOTE — Progress Notes (Signed)
GASTROENTEROLOGY OUTPATIENT CLINIC VISIT   Primary Care Provider Natalia Leatherwood, [place] [address] [place] [address] [phone]  Patient Profile: Christian Escobar is a 45 y.o. male with a pmh significant for asthma, hypertension, OSA, GERD.  The patient presents to the [facility] for an evaluation and management of problem(s) noted below:  Problem List 1. History of Helicobacter infection   2. LUQ pain   3. Left flank pain   4. Gastroesophageal reflux disease without esophagitis   5. Hepatic steatosis     History of Present Illness Please see initial consultation note for full details of HPI.  Interval History The patient presents for follow-up.  Since her last clinic visit, the patient has undergone an upper endoscopy.  He was found to have H. pylori and underwent an empiric esophageal dilation.  He is now status post H. pylori treatment with Pylera and continues on omeprazole therapy currently.  Infrequently he will use a Pepcid..  Overall, the issues of bloating and burning that were occurring have improved with PPI therapy and with the H. pylori treatment.  Patient has recently been evaluated by pulmonary and they have adjusted his sleep apnea settings.  This has helped him within just the last few days and he states he has noted a significant improvement in his chest discomfort as well.  He still has a pain in the left flank area that at times is burning and painful.  He cannot sleep on his left side due to discomfort.  Motion laterally can sometimes cause the discomfort to be present.  There is no prandial component to that pain.  GI Review of Systems Positive as above Negative for dysphagia currently, odynophagia, vomiting, nausea, change in bowel habits, melena, hematochezia  Review of Systems General: Denies fevers/chills/weight loss unintentionally Cardiovascular: Denies chest pain/palpitations Pulmonary: Denies shortness of  breath Gastroenterological: See HPI Genitourinary: Denies darkened urine Hematological: Denies easy bruising/bleeding Dermatological: Denies jaundice Psychological: Mood is stable   Medications Current Outpatient Medications  Medication Sig Dispense Refill  . ASPIRIN ADULT LOW STRENGTH 81 MG EC tablet Take 1 tablet (81 mg total) by mouth daily. 90 tablet 3  . colesevelam (WELCHOL) 625 MG tablet Take 2 tablets (1,250 mg total) by mouth 2 (two) times daily with a meal. 120 tablet 0  . famotidine (PEPCID) 20 MG tablet Take 1 tablet (20 mg total) by mouth 2 (two) times daily. 60 tablet 2  . fluticasone (FLONASE) 50 MCG/ACT nasal spray Place 1 spray into both nostrils at bedtime.    . hydrochlorothiazide (HYDRODIURIL) 25 MG tablet Take 1 tablet (25 mg total) by mouth daily. 30 tablet 5  . isosorbide mononitrate (IMDUR) 30 MG 24 hr tablet Take 1 tablet (30 mg total) by mouth daily. 30 tablet 5  . losartan (COZAAR) 50 MG tablet Take 1 tablet (50 mg total) by mouth daily. 30 tablet 5  . metoprolol tartrate (LOPRESSOR) 25 MG tablet TAKE 1 TABLET BY MOUTH TWICE A DAY 60 tablet 0  . omeprazole (PRILOSEC) 40 MG capsule Take 1 capsule (40 mg total) by mouth 2 (two) times daily before a meal. 90 capsule 3   No current facility-administered medications for this visit.    Allergies Allergies  Allergen Reactions  . Lipitor [Atorvastatin] Other (See Comments)    Elevated LFTs, elevated CK with atorvastatin  . Lisinopril Cough    Histories Past Medical History:  Diagnosis Date  . Allergy   . Asthma   . COVID-19 09/2019  .  Elevated CK 12/02/2019  . Elevated LFTs 12/02/2019  . GERD (gastroesophageal reflux disease)   . Hyperlipidemia   . Hypertension   . Sleep apnea    wears CPAP  . Sleep apnea with use of continuous positive airway pressure (CPAP)    Past Surgical History:  Procedure Laterality Date  . NO PAST SURGERIES     Social History   Socioeconomic History  . Marital status:  Married    Spouse name: Natalia Leatherwood  . Number of children: 0  . Years of education: 54  . Highest education level: Not on file  Occupational History  . Occupation: Administrator, sports  Tobacco Use  . Smoking status: Never Smoker  . Smokeless tobacco: Never Used  Vaping Use  . Vaping Use: Never used  Substance and Sexual Activity  . Alcohol use: No  . Drug use: No  . Sexual activity: Yes    Partners: Female    Comment: married  Other Topics Concern  . Not on file  Social History Narrative   Married to [place]. No children.    BS. Div. Chief Financial Officer.    Drinks caffeine.    Wears seatbelt, bicycle helmet. Smoke detector in the home.    Exercises 3x a week.    Firearms locked in the home.    Feels safe in his relationships.    Social Determinants of Health   Financial Resource Strain: Not on file  Food Insecurity: Not on file  Transportation Needs: Not on file  Physical Activity: Not on file  Stress: Not on file  Social Connections: Not on file  Intimate Partner Violence: Not on file   Family History  Problem Relation Age of Onset  . Hypertension Father   . Arthritis Father   . GER disease Father   . Colon cancer Neg Hx   . Esophageal cancer Neg Hx   . Inflammatory bowel disease Neg Hx   . Liver disease Neg Hx   . Pancreatic cancer Neg Hx   . Rectal cancer Neg Hx   . Stomach cancer Neg Hx    I have reviewed his medical, social, and family history in detail and updated the electronic medical record as necessary.    PHYSICAL EXAMINATION  Ht 5\' 8"  (1.727 m)   Wt 285 lb 9.6 oz (129.5 kg)   BMI 43.43 kg/m  Wt Readings from Last 3 Encounters:  12/14/20 285 lb 9.6 oz (129.5 kg)  11/16/20 286 lb 9.6 oz (130 kg)  10/16/20 286 lb (129.7 kg)  GEN: NAD, appears stated age, doesn't appear chronically ill PSYCH: Cooperative, without pressured speech EYE: Conjunctivae pink, sclerae anicteric ENT: Masked CV: Nontachycardic RESP: No audible wheezing GI:  NABS, soft, protuberant abdomen, nontender, without rebound MSK/EXT: No lower extremity edema SKIN: No jaundice NEURO:  Alert & Oriented x 3, no focal deficits case   REVIEW OF DATA  I reviewed the following data at the time of this encounter:  GI Procedures and Studies  December 2021 EGD - No gross lesions in esophagus. Biopsied. Dilated. - Z-line irregular, 41 cm from the incisors. - Erythematous mucosa in the stomach. No other gross lesions in the stomach. Biopsied. - No gross lesions in the duodenal bulb, in the first portion of the duodenum and in the second portion of the duodenum. Pathology Diagnosis 1. Surgical [P], stomach, random sites gastric - MILD CHRONIC GASTRITIS WITH ACTIVITY - NO INTESTINAL METAPLASIA IDENTIFIED - SEE COMMENT ADDITIONAL INFORMATION: 1. H. pylori immunohistochemistry is POSITIVE for  RARE microorganisms. 2. Surgical [P], random sites esophagus - REACTIVE SQUAMOUS MUCOSA WITH INCREASED INTRAEPITHELIAL LYMPHOCYTES AND RARE EOSINOPHILS - SEE COMMENT  Laboratory Studies  Reviewed those in epic  Imaging Studies  No new relevant studies to review   ASSESSMENT  Mr. Lawless is a 45 y.o. male with a pmh significant for asthma, hypertension, OSA, GERD.  The patient is seen today for evaluation and management of:  1. History of Helicobacter infection   2. LUQ pain   3. Left flank pain   4. Gastroesophageal reflux disease without esophagitis   5. Hepatic steatosis    The patient is clinically and hemodynamically stable.  Many of his symptoms, as I had previously considered, were likely a result of his sleep apnea and CPAP settings.  Hopefully with the adjustments he will continue to have a clinical improvement.  The finding of H. pylori and treatment for that has improved symptoms of bloating and discomfort.  Still has very infrequent GERD symptoms at this time.  We will move forward with trying to ensure eradication of H. pylori.  He will decrease his  omeprazole until we can stop it at which point he will then give [place] a stool sample to ensure eradication.  He may restart omeprazole thereafter.  The etiology of the patient's left upper quadrant/left flank pain is most likely musculoskeletal in origin, but with the persistence of symptoms and significance of discomfort that it is causing him, it is reasonable for further imaging to be performed.  We will move forward with a cross-sectional CT abdomen to better define the anatomy at this area and ensure nothing else is being missed.  Follow-up will be dictated after completion of his H. pylori stool antigen testing as well as CT scans.  All patient questions were answered to the best of my ability, and the patient agrees to the aforementioned plan of action with follow-up as indicated.Marland Kitchen   PLAN  Decrease omeprazole to 40 mg -Continue daily through 12/21/2020 -Then transition to every other day through 01/04/2021 -Then stop it -Give stool sample for H. pylori stool antigen on week of 4/4 -Once stool antigen has been received, patient can restart omeprazole 40 mg daily CT abdomen to further evaluate left upper quadrant/left flank pain and will also evaluate the entirety of the liver due to previous concern for hepatic steatosis on prior CT chest imaging We will likely obtain biannual LFTs   Orders Placed This Encounter  Procedures  . Helicobacter pylori special antigen  . CT ABDOMEN W WO CONTRAST    New Prescriptions   No medications on file   Modified Medications   No medications on file    Planned Follow Up No follow-ups on file.   Total Time in Face-to-Face and in Coordination of Care for patient including independent/personal interpretation/review of prior testing, medical history, examination, medication adjustment, communicating results with the patient directly, and documentation with the EHR is 25 minutes.   Corliss Parish, MD [facility] Advanced  Endoscopy Office # [phone]

## 2020-12-17 ENCOUNTER — Telehealth: Payer: Self-pay | Admitting: Gastroenterology

## 2020-12-17 NOTE — Telephone Encounter (Signed)
Please send to the CMA for Dr Meridee Score. Thank you

## 2020-12-17 NOTE — Telephone Encounter (Signed)
Called and gave lab results to [facility].

## 2020-12-17 NOTE — Telephone Encounter (Signed)
CT dept called requesting a lab order for patient prior to his appointment on 12/19/20.

## 2020-12-18 ENCOUNTER — Encounter: Payer: Self-pay | Admitting: Gastroenterology

## 2020-12-18 DIAGNOSIS — K760 Fatty (change of) liver, not elsewhere classified: Secondary | ICD-10-CM | POA: Insufficient documentation

## 2020-12-18 DIAGNOSIS — Z8619 Personal history of other infectious and parasitic diseases: Secondary | ICD-10-CM

## 2020-12-18 DIAGNOSIS — R109 Unspecified abdominal pain: Secondary | ICD-10-CM | POA: Insufficient documentation

## 2020-12-18 DIAGNOSIS — R1012 Left upper quadrant pain: Secondary | ICD-10-CM | POA: Insufficient documentation

## 2020-12-18 HISTORY — DX: Personal history of other infectious and parasitic diseases: Z86.19

## 2020-12-18 HISTORY — DX: Fatty (change of) liver, not elsewhere classified: K76.0

## 2020-12-19 ENCOUNTER — Other Ambulatory Visit: Payer: Self-pay

## 2020-12-19 ENCOUNTER — Ambulatory Visit (INDEPENDENT_AMBULATORY_CARE_PROVIDER_SITE_OTHER)
Admission: RE | Admit: 2020-12-19 | Discharge: 2020-12-19 | Disposition: A | Discharge status: Home / Self Care | Payer: 59 | Source: Ambulatory Visit | Attending: Gastroenterology | Admitting: Gastroenterology

## 2020-12-19 DIAGNOSIS — R109 Unspecified abdominal pain: Secondary | ICD-10-CM | POA: Diagnosis not present

## 2020-12-19 DIAGNOSIS — R1012 Left upper quadrant pain: Secondary | ICD-10-CM | POA: Diagnosis not present

## 2020-12-19 DIAGNOSIS — Z8619 Personal history of other infectious and parasitic diseases: Secondary | ICD-10-CM

## 2020-12-19 MED ORDER — IOHEXOL 300 MG/ML  SOLN
100.0000 mL | Freq: Once | INTRAMUSCULAR | Status: AC | PRN
  Administered 2020-12-19: 100 mL via INTRAVENOUS

## 2020-12-20 NOTE — Telephone Encounter (Signed)
Called [facility] ([facility]) and spoke with [facility]. She was able to change the settings while I was on the phone. Sent a MyChart message to patient to let him know. Will await his response to make sure his machine displays the correct setting.

## 2020-12-25 ENCOUNTER — Other Ambulatory Visit: Payer: Self-pay

## 2020-12-25 DIAGNOSIS — R109 Unspecified abdominal pain: Secondary | ICD-10-CM

## 2020-12-25 DIAGNOSIS — R935 Abnormal findings on diagnostic imaging of other abdominal regions, including retroperitoneum: Secondary | ICD-10-CM

## 2020-12-26 ENCOUNTER — Telehealth: Payer: Self-pay | Admitting: *Deleted

## 2020-12-26 NOTE — Telephone Encounter (Signed)
Per referral 12/25/20 - Dr. Meridee Score - gave patient upcoming appts - mailed welcome packet with calendar.

## 2021-01-07 ENCOUNTER — Encounter: Payer: Self-pay | Admitting: Family Medicine

## 2021-01-07 ENCOUNTER — Ambulatory Visit: Payer: 59 | Admitting: Family Medicine

## 2021-01-07 ENCOUNTER — Other Ambulatory Visit: Payer: Self-pay

## 2021-01-07 VITALS — BP 121/79 | HR 67 | Temp 98.5°F | Ht 68.0 in | Wt 291.0 lb

## 2021-01-07 DIAGNOSIS — R1012 Left upper quadrant pain: Secondary | ICD-10-CM

## 2021-01-07 DIAGNOSIS — R590 Localized enlarged lymph nodes: Secondary | ICD-10-CM

## 2021-01-07 HISTORY — DX: Localized enlarged lymph nodes: R59.0

## 2021-01-07 LAB — COMPREHENSIVE METABOLIC PANEL
ALT: 38 U/L (ref 0–53)
AST: 22 U/L (ref 0–37)
Albumin: 4.7 g/dL (ref 3.5–5.2)
Alkaline Phosphatase: 61 U/L (ref 39–117)
BUN: 16 mg/dL (ref 6–23)
CO2: 29 mEq/L (ref 19–32)
Calcium: 9.9 mg/dL (ref 8.4–10.5)
Chloride: 105 mEq/L (ref 96–112)
Creatinine, Ser: 0.98 mg/dL (ref 0.40–1.50)
GFR: 94.77 mL/min (ref >60.00)
Glucose, Bld: 91 mg/dL (ref 70–99)
Potassium: 4.1 mEq/L (ref 3.5–5.1)
Sodium: 142 mEq/L (ref 135–145)
Total Bilirubin: 0.5 mg/dL (ref 0.2–1.2)
Total Protein: 6.9 g/dL (ref 6.0–8.3)

## 2021-01-07 LAB — CBC WITH DIFFERENTIAL/PLATELET
Basophils Absolute: 0.1 10*3/uL (ref 0.0–0.1)
Basophils Relative: 0.7 % (ref 0.0–3.0)
Eosinophils Absolute: 0.2 10*3/uL (ref 0.0–0.7)
Eosinophils Relative: 2.2 % (ref 0.0–5.0)
HCT: 45.7 % (ref 39.0–52.0)
Hemoglobin: 15.3 g/dL (ref 13.0–17.0)
Lymphocytes Relative: 28.5 % (ref 12.0–46.0)
Lymphs Abs: 2.3 10*3/uL (ref 0.7–4.0)
MCHC: 33.6 g/dL (ref 30.0–36.0)
MCV: 87.9 fl (ref 78.0–100.0)
Monocytes Absolute: 0.6 10*3/uL (ref 0.1–1.0)
Monocytes Relative: 7.3 % (ref 3.0–12.0)
Neutro Abs: 4.9 10*3/uL (ref 1.4–7.7)
Neutrophils Relative %: 61.3 % (ref 43.0–77.0)
Platelets: 305 10*3/uL (ref 150.0–400.0)
RBC: 5.19 Mil/uL (ref 4.22–5.81)
RDW: 13.9 % (ref 11.5–15.5)
WBC: 8.1 10*3/uL (ref 4.0–10.5)

## 2021-01-07 LAB — SEDIMENTATION RATE: Sed Rate: 3 mm/hr (ref 0–15)

## 2021-01-07 NOTE — Progress Notes (Signed)
This visit occurred during the SARS-CoV-2 public health emergency.  Safety protocols were in place, including screening questions prior to the visit, additional usage of staff PPE, and extensive cleaning of exam room while observing appropriate contact time as indicated for disinfecting solutions.    Christian Escobar , 06-10-1976, 45 y.o., male MRN: [id number] Patient Care Team    Relationship Specialty Notifications Start End  Ma Hillock, DO PCP - General Family Medicine  09/01/16   Rhetta Mura., MD Referring Physician Cardiology  12/01/19     Chief Complaint  Patient presents with  . Results    Pt would like to discuss CT findings; Pt has appt with hemat 4/19     Subjective: Pt presents for an OV to discuss his GI work up and CT result ordered by GI.  He states he really does not understand the CT and only knows that he had lymph nodes enlarged and was sent to oncology.  We reviewed CT ordered by GI together.  Retroperitoneal and periportal lymph node (1cm) enlargement noted. He denies night sweats, unintentional weight loss or fatigue. He has had no recent illnesses. He has no fhx of cancers, leukemia or lymphoma.  He continues to complain of a persistent LUQ/lower chest discomfort when laying on his side or abdomen. He reports discomfort feels like a "pressure" feeling. He has had normal Ct chest and abd without answers to this complaint. Spleen is reported normal on his CT report.   Depression screen [facility] 2/9 09/19/2020 11/18/2019 09/29/2016 09/01/2016  Decreased Interest 0 0 0 0  Down, Depressed, Hopeless 0 0 0 0  PHQ - 2 Score 0 0 0 0    Allergies  Allergen Reactions  . Lipitor [Atorvastatin] Other (See Comments)    Elevated LFTs, elevated CK with atorvastatin  . Lisinopril Cough   Social History   Social History Narrative   Married to [name]. No children.    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    Drinks caffeine.    Wears seatbelt, bicycle helmet. Smoke  detector in the home.    Exercises 3x a week.    Firearms locked in the home.    Feels safe in his relationships.    Past Medical History:  Diagnosis Date  . Allergy   . Asthma   . COVID-19 09/2019  . Elevated CK 12/02/2019  . Elevated LFTs 12/02/2019  . GERD (gastroesophageal reflux disease)   . Hyperlipidemia   . Hypertension   . Sleep apnea    wears CPAP  . Sleep apnea with use of continuous positive airway pressure (CPAP)    Past Surgical History:  Procedure Laterality Date  . NO PAST SURGERIES     Family History  Problem Relation Age of Onset  . Hypertension Father   . Arthritis Father   . GER disease Father   . Colon cancer Neg Hx   . Esophageal cancer Neg Hx   . Pancreatic cancer Neg Hx   . Rectal cancer Neg Hx   . Stomach cancer Neg Hx    Allergies as of 01/07/2021      Reactions   Lipitor [atorvastatin] Other (See Comments)   Elevated LFTs, elevated CK with atorvastatin   Lisinopril Cough      Medication List       Accurate as of January 07, 2021 11:25 AM. If you have any questions, ask your nurse or doctor.        Aspirin Adult Low Strength 81  MG EC tablet Generic drug: aspirin Take 1 tablet (81 mg total) by mouth daily.   colesevelam 625 MG tablet Commonly known as: Welchol Take 2 tablets (1,250 mg total) by mouth 2 (two) times daily with a meal.   famotidine 20 MG tablet Commonly known as: PEPCID Take 1 tablet (20 mg total) by mouth 2 (two) times daily.   fluticasone 50 MCG/ACT nasal spray Commonly known as: FLONASE Place 1 spray into both nostrils at bedtime.   hydrochlorothiazide 25 MG tablet Commonly known as: HYDRODIURIL Take 1 tablet (25 mg total) by mouth daily.   isosorbide mononitrate 30 MG 24 hr tablet Commonly known as: IMDUR Take 1 tablet (30 mg total) by mouth daily.   losartan 50 MG tablet Commonly known as: COZAAR Take 1 tablet (50 mg total) by mouth daily.   metoprolol tartrate 25 MG tablet Commonly known as:  LOPRESSOR TAKE 1 TABLET BY MOUTH TWICE A DAY   omeprazole 40 MG capsule Commonly known as: PRILOSEC Take 1 capsule (40 mg total) by mouth 2 (two) times daily before a meal.       All past medical history, surgical history, allergies, family history, immunizations andmedications were updated in the EMR today and reviewed under the history and medication portions of their EMR.     ROS: Negative, with the exception of above mentioned in HPI   Objective:  BP 121/79   Pulse 67   Temp 98.5 F (36.9 C) (Oral)   Ht 5' 8"  (1.727 m)   Wt 291 lb (132 kg)   SpO2 97%   BMI 44.25 kg/m  Body mass index is 44.25 kg/m. Gen: Afebrile. No acute distress. Nontoxic in appearance, well developed, well nourished.  HENT: AT. [place].  Eyes:Pupils Equal Round Reactive to light, Extraocular movements intact,  Conjunctiva without redness, discharge or icterus. Neck/lymp/endocrine: Supple,no cervical  lymphadenopathy CV: RRR no murmur, noedema Chest: CTAB, no wheeze or crackles. Good air movement, normal resp effort.  Abd: Soft. obese. NTND. BS present. no Masses palpated. No rebound or guarding.  Skin: no rashes, purpura or petechiae.  Neuro:  Normal gait. PERLA. EOMi. Alert. Oriented x3  Psych: Normal affect, dress and demeanor. Normal speech. Normal thought content and judgment.  No exam data present No results found. No results found for this or any previous visit (from the past 24 hour(s)).  Assessment/Plan: Christian Escobar is a 45 y.o. male present for OV for  Periportal lymphadenopathy/Retroperitoneal lymphadenopathy/LUQ discomfort Discussed role of lymph nodes and monitoring of lymphadenopathy. We discussed further studies and elected to start work up, in hopes t be able to provide more info to onc Uncertain of LUQ discomfort- ? Enlarging spleen for him, but still wnl per image? Vs MSK? - CBC w/Diff - Pathologist smear review - Lactate dehydrogenase - Sedimentation rate - Comp Met (CMET) -  He has est appt scheduled w/ onc.    Reviewed expectations re: course of current medical issues.  Discussed self-management of symptoms.  Outlined signs and symptoms indicating need for more acute intervention.  Patient verbalized understanding and all questions were answered.  Patient received an After-Visit Summary.    Orders Placed This Encounter  Procedures  . CBC w/Diff  . Pathologist smear review  . Lactate dehydrogenase  . Sedimentation rate  . Comp Met (CMET)   No orders of the defined types were placed in this encounter.  Referral Orders  No referral(s) requested today     Note is dictated utilizing voice recognition software. Although  note has been proof read prior to signing, occasional typographical errors still can be missed. If any questions arise, please do not hesitate to call for verification.   electronically signed by:  Howard Pouch, DO  [place]

## 2021-01-07 NOTE — Patient Instructions (Signed)
We will call you with lab results.   Lymphadenopathy  Lymphadenopathy means that your lymph glands are swollen or larger than normal. Lymph glands, also called lymph nodes, are collections of tissue that filter excess fluid, bacteria, viruses, and waste from your bloodstream. They are part of your body's disease-fighting system (immune system), which protects your body from germs. There may be different causes of lymphadenopathy, depending on where it is in your body. Some types go away on their own. Lymphadenopathy can occur anywhere that you have lymph glands, including these areas:  Neck (cervical lymphadenopathy).  Chest (mediastinal lymphadenopathy).  Lungs (hilar lymphadenopathy).  Underarms (axillary lymphadenopathy).  Groin (inguinal lymphadenopathy). When your immune system responds to germs, infection-fighting cells and fluid build up in your lymph glands. This causes some swelling and enlargement. If the lymph nodes do not go back to normal size after you have an infection or disease, your health care provider may do tests. These tests help to monitor your condition and find the reason why the glands are still swollen and enlarged. Follow these instructions at home:  Get plenty of rest.  Your health care provider may recommend over-the-counter medicines for pain. Take over-the-counter and prescription medicines only as told by your health care provider.  If directed, apply heat to swollen lymph glands as often as told by your health care provider. Use the heat source that your health care provider recommends, such as a moist heat pack or a heating pad. ? Place a towel between your skin and the heat source. ? Leave the heat on for 20-30 minutes. ? Remove the heat if your skin turns bright red. This is especially important if you are unable to feel pain, heat, or cold. You may have a greater risk of getting burned.  Check your affected lymph glands every day for changes. Check  other lymph gland areas as told by your health care provider. Check for changes such as: ? More swelling. ? Sudden increase in size. ? Redness or pain. ? Hardness.  Keep all follow-up visits. This is important.   Contact a health care provider if you have:  Lymph glands that: ? Are still swollen after 2 weeks. ? Have suddenly gotten bigger or the swelling spreads. ? Are red, painful, or hard.  Fluid leaking from the skin near an enlarged lymph gland.  Problems with breathing.  A fever, chills, or night sweats.  Fatigue.  A sore throat.  Pain in your abdomen.  Weight loss. Get help right away if you have:  Severe pain.  Chest pain.  Shortness of breath. These symptoms may represent a serious problem that is an emergency. Do not wait to see if the symptoms will go away. Get medical help right away. Call your local emergency services (911 in the [place] Do not drive yourself to the hospital. Summary  Lymphadenopathy means that your lymph glands are swollen or larger than normal.  Lymph glands, also called lymph nodes, are collections of tissue that filter excess fluid, bacteria, viruses, and waste from the bloodstream. They are part of your body's disease-fighting system (immune system).  Lymphadenopathy can occur anywhere that you have lymph glands.  If the lymph nodes do not go back to normal size after you have an infection or disease, your health care provider may do tests to monitor your condition and find the reason why the glands are still swollen and enlarged.  Check your affected lymph glands every day for changes. Check other lymph gland areas  as told by your health care provider. This information is not intended to replace advice given to you by your health care provider. Make sure you discuss any questions you have with your health care provider. Document Revised: 08/01/2020 Document Reviewed: 08/01/2020 Elsevier Patient Education  2021 [employer].

## 2021-01-08 LAB — PATHOLOGIST SMEAR REVIEW

## 2021-01-08 LAB — LACTATE DEHYDROGENASE: LDH: 169 U/L (ref 100–220)

## 2021-01-19 ENCOUNTER — Other Ambulatory Visit: Payer: Self-pay | Admitting: Family Medicine

## 2021-01-21 ENCOUNTER — Other Ambulatory Visit: Payer: Self-pay

## 2021-01-21 ENCOUNTER — Other Ambulatory Visit: Payer: 59

## 2021-01-21 DIAGNOSIS — R1012 Left upper quadrant pain: Secondary | ICD-10-CM

## 2021-01-21 DIAGNOSIS — Z8619 Personal history of other infectious and parasitic diseases: Secondary | ICD-10-CM

## 2021-01-21 DIAGNOSIS — R109 Unspecified abdominal pain: Secondary | ICD-10-CM

## 2021-01-21 MED ORDER — ASPIRIN ADULT LOW STRENGTH 81 MG PO TBEC: 81.0000 mg | DELAYED_RELEASE_TABLET | Freq: Every day | ORAL | 0 refills | Status: DC

## 2021-01-22 LAB — HELICOBACTER PYLORI  SPECIAL ANTIGEN
MICRO NUMBER:: 11727517
SPECIMEN QUALITY: ADEQUATE

## 2021-01-24 ENCOUNTER — Other Ambulatory Visit: Payer: 59

## 2021-01-24 ENCOUNTER — Ambulatory Visit: Payer: 59 | Admitting: Hematology & Oncology

## 2021-01-24 ENCOUNTER — Other Ambulatory Visit: Payer: Self-pay | Admitting: Gastroenterology

## 2021-02-05 ENCOUNTER — Telehealth: Payer: Self-pay

## 2021-02-05 ENCOUNTER — Inpatient Hospital Stay: Payer: 59 | Attending: Hematology & Oncology

## 2021-02-05 ENCOUNTER — Other Ambulatory Visit: Payer: Self-pay

## 2021-02-05 ENCOUNTER — Encounter: Payer: Self-pay | Admitting: Hematology & Oncology

## 2021-02-05 ENCOUNTER — Inpatient Hospital Stay (HOSPITAL_BASED_OUTPATIENT_CLINIC_OR_DEPARTMENT_OTHER): Payer: 59 | Admitting: Hematology & Oncology

## 2021-02-05 VITALS — BP 139/76 | HR 61 | Temp 98.4°F | Resp 16 | Ht 69.0 in | Wt 271.0 lb

## 2021-02-05 DIAGNOSIS — R599 Enlarged lymph nodes, unspecified: Secondary | ICD-10-CM | POA: Diagnosis not present

## 2021-02-05 DIAGNOSIS — R590 Localized enlarged lymph nodes: Secondary | ICD-10-CM | POA: Insufficient documentation

## 2021-02-05 LAB — CBC WITH DIFFERENTIAL (CANCER CENTER ONLY)
Abs Immature Granulocytes: 0.02 10*3/uL (ref 0.00–0.07)
Basophils Absolute: 0 10*3/uL (ref 0.0–0.1)
Basophils Relative: 1 %
Eosinophils Absolute: 0.2 10*3/uL (ref 0.0–0.5)
Eosinophils Relative: 3 %
HCT: 44 % (ref 39.0–52.0)
Hemoglobin: 14.8 g/dL (ref 13.0–17.0)
Immature Granulocytes: 0 %
Lymphocytes Relative: 32 %
Lymphs Abs: 1.9 10*3/uL (ref 0.7–4.0)
MCH: 29.7 pg (ref 26.0–34.0)
MCHC: 33.6 g/dL (ref 30.0–36.0)
MCV: 88.2 fL (ref 80.0–100.0)
Monocytes Absolute: 0.6 10*3/uL (ref 0.1–1.0)
Monocytes Relative: 10 %
Neutro Abs: 3.3 10*3/uL (ref 1.7–7.7)
Neutrophils Relative %: 54 %
Platelet Count: 280 10*3/uL (ref 150–400)
RBC: 4.99 MIL/uL (ref 4.22–5.81)
RDW: 13.2 % (ref 11.5–15.5)
WBC Count: 6 10*3/uL (ref 4.0–10.5)
nRBC: 0 % (ref 0.0–0.2)

## 2021-02-05 LAB — CMP (CANCER CENTER ONLY)
ALT: 29 U/L (ref 0–44)
AST: 22 U/L (ref 15–41)
Albumin: 4.8 g/dL (ref 3.5–5.0)
Alkaline Phosphatase: 64 U/L (ref 38–126)
Anion gap: 8 (ref 5–15)
BUN: 14 mg/dL (ref 6–20)
CO2: 31 mmol/L (ref 22–32)
Calcium: 10.2 mg/dL (ref 8.9–10.3)
Chloride: 102 mmol/L (ref 98–111)
Creatinine: 1.09 mg/dL (ref 0.61–1.24)
GFR, Estimated: >60 mL/min (ref >60)
Glucose, Bld: 103 mg/dL — ABNORMAL HIGH (ref 70–99)
Potassium: 3.3 mmol/L — ABNORMAL LOW (ref 3.5–5.1)
Sodium: 141 mmol/L (ref 135–145)
Total Bilirubin: 0.9 mg/dL (ref 0.3–1.2)
Total Protein: 7.6 g/dL (ref 6.5–8.1)

## 2021-02-05 LAB — LACTATE DEHYDROGENASE: LDH: 183 U/L (ref 98–192)

## 2021-02-05 NOTE — Telephone Encounter (Signed)
S/w pt and he is aware of his appt per 02/05/21 los, he is also aware that his ct scan will be sch closer to the appt time and he will need to p/u contrast as well   Sinthia Karabin

## 2021-02-05 NOTE — Progress Notes (Signed)
Referral MD  Reason for Referral: Indeterminate abdominal adenopathy  Chief Complaint  Patient presents with  . New Patient (Initial Visit)  : I had swollen lymph nodes in my abdomen.  HPI: Christian Escobar is a very interesting 45 year old white male.  He is originally from [place].  He has an incredibly interesting job.  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  We must use the proper terminology with respect to his product that he makes.  He has been having some abdominal discomfort.  He has had an upper endoscopy.  He did not have a hiatal hernia.  Had a CT scan that was done.  This was done on 12/19/2020.  Everything looked relatively unremarkable.  Of course, the radiologist said that there were some prominent retroperitoneal lymph nodes.  These measured up to 1 centimeter.  There is no splenomegaly.  Everything else looks fine.  Based on this, he was referred to the [facility] for an evaluation.  I am incredibly surprised that this would even cause any problems for him.  I have to believe that he has had these lymph nodes for quite a while.  He has never had any abdominal surgery.  There is no change in bowel or bladder habits.  There is no history of cancer in the family.  He has had some weight loss but this is a determined weight loss.  He has had a little bit of chest pain.  Is had a CT of the chest in the past.  Is no swelling in the legs.  He has had no or rashes.  He has not felt any palpable lymph nodes.  He does not smoke.  He does not have any alcoholic beverages.  He does travel for his job.  Overall, I was his performance status is ECOG 0.   Past Medical History:  Diagnosis Date  . Allergy   . Asthma   . COVID-19 09/2019  . Elevated CK 12/02/2019  . Elevated LFTs 12/02/2019  . GERD (gastroesophageal reflux disease)   . Hyperlipidemia   . Hypertension   . Sleep apnea    wears CPAP  . Sleep apnea  with use of continuous positive airway pressure (CPAP)   :  Past Surgical History:  Procedure Laterality Date  . NO PAST SURGERIES    :   Current Outpatient Medications:  .  ASPIRIN ADULT LOW STRENGTH 81 MG EC tablet, Take 1 tablet (81 mg total) by mouth daily., Disp: 30 tablet, Rfl: 0 .  colesevelam (WELCHOL) 625 MG tablet, Take 2 tablets (1,250 mg total) by mouth 2 (two) times daily with a meal., Disp: 120 tablet, Rfl: 0 .  [facility] ASPIRIN LOW DOSE 81 MG EC tablet, Take 81 mg by mouth daily., Disp: , Rfl:  .  famotidine (PEPCID) 20 MG tablet, Take 1 tablet (20 mg total) by mouth 2 (two) times daily., Disp: 60 tablet, Rfl: 2 .  fluticasone (FLONASE) 50 MCG/ACT nasal spray, Place 1 spray into both nostrils at bedtime., Disp: , Rfl:  .  hydrochlorothiazide (HYDRODIURIL) 25 MG tablet, TAKE 1 TABLET BY MOUTH EVERY DAY, Disp: 30 tablet, Rfl: 0 .  isosorbide mononitrate (IMDUR) 30 MG 24 hr tablet, Take 1 tablet (30 mg total) by mouth daily., Disp: 30 tablet, Rfl: 5 .  losartan (COZAAR) 50 MG tablet, TAKE 1 TABLET BY MOUTH EVERY DAY, Disp: 30 tablet, Rfl: 0 .  metoprolol tartrate (LOPRESSOR) 25 MG tablet,  TAKE 1 TABLET BY MOUTH TWICE A DAY, Disp: 60 tablet, Rfl: 0 .  omeprazole (PRILOSEC) 40 MG capsule, TAKE 1 CAPSULE (40 MG TOTAL) BY MOUTH 2 (TWO) TIMES DAILY AT LEAST 30 MINUTES BEFORE A MEAL., Disp: 180 capsule, Rfl: 1:  :  Allergies  Allergen Reactions  . Lipitor [Atorvastatin] Other (See Comments)    Elevated LFTs, elevated CK with atorvastatin  . Lisinopril Cough  :  Family History  Problem Relation Age of Onset  . Hypertension Father   . Arthritis Father   . GER disease Father   . Colon cancer Neg Hx   . Esophageal cancer Neg Hx   . Pancreatic cancer Neg Hx   . Rectal cancer Neg Hx   . Stomach cancer Neg Hx   :  Social History   Socioeconomic History  . Marital status: Married    Spouse name: Christian Escobar  . Number of children: 0  . Years of education: 72  . Highest  education level: Not on file  Occupational History  . Occupation: Administrator, sports  Tobacco Use  . Smoking status: Never Smoker  . Smokeless tobacco: Never Used  Vaping Use  . Vaping Use: Never used  Substance and Sexual Activity  . Alcohol use: No  . Drug use: No  . Sexual activity: Yes    Partners: Female    Comment: married  Other Topics Concern  . Not on file  Social History Narrative   Married to [place]. No children.    BS. Div. Chief Financial Officer.    Drinks caffeine.    Wears seatbelt, bicycle helmet. Smoke detector in the home.    Exercises 3x a week.    Firearms locked in the home.    Feels safe in his relationships.    Social Determinants of Health   Financial Resource Strain: Not on file  Food Insecurity: Not on file  Transportation Needs: Not on file  Physical Activity: Not on file  Stress: Not on file  Social Connections: Not on file  Intimate Partner Violence: Not on file  :  Review of Systems  Constitutional: Negative.   HENT: Negative.   Eyes: Negative.   Respiratory: Negative.   Cardiovascular: Positive for chest pain.  Gastrointestinal: Positive for abdominal pain.  Genitourinary: Negative.   Musculoskeletal: Negative.   Skin: Negative.   Neurological: Negative.   Endo/Heme/Allergies: Negative.   Psychiatric/Behavioral: Negative.      Exam:  This is a obese white male in no obvious distress.  Vital signs show temperature of 98.4.  Pulse 61.  Blood pressure 139/76.  Weight is 271 pounds.  Head and neck exam shows no ocular or oral lesions.  He has no palpable cervical or supraclavicular lymph nodes.  Lungs are clear bilaterally.  Cardiac exam regular rate and rhythm with no murmurs, rubs or bruits.  Axillary exam shows no bilateral axillary adenopathy.  Abdomen is obese but soft.  He has good bowel sounds.  There is no fluid wave.  There is no palpable liver or spleen tip.  There is no inguinal adenopathy.  Back exam shows no tenderness  over the spine, ribs or hips.  Extremities shows no clubbing, cyanosis or edema.  Neurological exam shows no focal neurological deficits.  Skin exam shows no rashes, ecchymoses or petechia.  There are no suspicious hyperpigmented skin lesions.  He has a seborrheic keratoses in the right lower abdominal area.  @IPVITALS @   Recent Labs    02/05/21 1057  WBC 6.0  HGB 14.8  HCT 44.0  PLT 280   Recent Labs    02/05/21 1057  NA 141  K 3.3*  CL 102  CO2 31  GLUCOSE 103*  BUN 14  CREATININE 1.09  CALCIUM 10.2    Blood smear review: Normochromic and normocytic population of red blood cells.  There are no nucleated red blood cells.  He has no teardrop cells.  There are no schistocytes or spherocytes.  He has no rouleaux formation.  White blood cells show immaturity of neutrophils.  He has no hypersegmented polys.  There are no immature myeloid or lymphoid cells.  There are no immature lymphocytes.  There are no blasts.  Platelets are adequate number and size.  Pathology: None    Assessment and Plan: Mr. Lindon is a very nice 45 year old white male.  He has indeterminate adenopathy in the abdomen.  His lymph nodes are a centimeter.  I cannot imagine that this is going to be a problem for him.  Of course, we are stuck in that we do not know how long he has had the lymph nodes.  Be that as it may, we can have to repeat a scan to see if there are any change in the lymph nodes.  I just cannot imagine that this would be malignant.  In a 45 year old male, 1 would have to worry about testicular cancer.  I see nothing on his exam that would suggest that.  He has not noted any testicular swelling or pain.  I would think that if this was adenopathy from testicular cancer, this would be following up his aorta.  I cannot find anything that would suggest a hematologic malignancy such as Hodgkin's disease or non-Hodgkin's lymphoma.  Again, I have to believe that this adenopathy is going be reactive.  The  radiologist even commented that he felt that the lymph nodes were probably reactive.  Mostly, I just reassured there is no back that everything was going be okay from my perspective.  He will clearly be the healthiest person that I see all month.  We will get him back in 6 months.  I will do a CT scan the same day that I see him.

## 2021-02-06 LAB — IGG, IGA, IGM
IgA: 167 mg/dL (ref 90–386)
IgG (Immunoglobin G), Serum: 1051 mg/dL (ref 603–1613)
IgM (Immunoglobulin M), Srm: 58 mg/dL (ref 20–172)

## 2021-02-07 LAB — PROTEIN ELECTROPHORESIS, SERUM, WITH REFLEX
A/G Ratio: 1.3 (ref 0.7–1.7)
Albumin ELP: 3.9 g/dL (ref 2.9–4.4)
Alpha-1-Globulin: 0.2 g/dL (ref 0.0–0.4)
Alpha-2-Globulin: 0.6 g/dL (ref 0.4–1.0)
Beta Globulin: 1.1 g/dL (ref 0.7–1.3)
Gamma Globulin: 1.2 g/dL (ref 0.4–1.8)
Globulin, Total: 3.1 g/dL (ref 2.2–3.9)
Total Protein ELP: 7 g/dL (ref 6.0–8.5)

## 2021-02-12 ENCOUNTER — Other Ambulatory Visit: Payer: Self-pay | Admitting: Family Medicine

## 2021-02-16 ENCOUNTER — Other Ambulatory Visit: Payer: Self-pay | Admitting: Family Medicine

## 2021-02-22 ENCOUNTER — Other Ambulatory Visit: Payer: Self-pay

## 2021-02-22 ENCOUNTER — Encounter: Payer: Self-pay | Admitting: Gastroenterology

## 2021-02-22 ENCOUNTER — Ambulatory Visit: Payer: 59 | Admitting: Gastroenterology

## 2021-02-22 VITALS — BP 126/68 | HR 74 | Ht 69.0 in | Wt 274.0 lb

## 2021-02-22 DIAGNOSIS — M799 Soft tissue disorder, unspecified: Secondary | ICD-10-CM | POA: Diagnosis not present

## 2021-02-22 DIAGNOSIS — R0781 Pleurodynia: Secondary | ICD-10-CM

## 2021-02-22 DIAGNOSIS — R109 Unspecified abdominal pain: Secondary | ICD-10-CM

## 2021-02-22 DIAGNOSIS — K219 Gastro-esophageal reflux disease without esophagitis: Secondary | ICD-10-CM | POA: Diagnosis not present

## 2021-02-22 NOTE — Progress Notes (Signed)
GASTROENTEROLOGY OUTPATIENT CLINIC VISIT   Primary Care Provider Christian Escobar, [place] [address] [place] [address] [phone]  Patient Profile: Christian Escobar is a 45 y.o. male with a pmh significant for asthma, hypertension, OSA, GERD, H. pylori gastritis (status post eradication).  The patient presents to the [facility] for an evaluation and management of problem(s) noted below:  Problem List 1. Left flank pain   2. Gastroesophageal reflux disease, unspecified whether esophagitis present   3. Musculoskeletal disorder   4. Lower rib pain     History of Present Illness Please see initial consultation note and prior progress notes for full details of HPI.  Interval History The patient presents for a scheduled follow-up.  Patient overall has had significant improvement in his aerophagia and bloating.  Still has discomfort in his left side especially when he lies on that side or is being more active.  He noted that as he decreased his PPI dosing his GERD symptoms became more apparent and significant.  He has restarted his PPI therapy.  Patient denies any dysphagia or odynophagia symptoms.  Overall feels better than prior but wishes that he did not have discomfort still in left side.  GI Review of Systems Positive as above Negative for nausea, vomiting, change in bowel habits, melena, hematochezia   Review of Systems General: Denies fevers/chills/weight loss unintentionally Cardiovascular: Denies chest pain/palpitations Pulmonary: Denies shortness of breath Gastroenterological: See HPI Genitourinary: Denies darkened urine Hematological: Denies easy bruising/bleeding Dermatological: Denies jaundice Psychological: Mood is stable   Medications Current Outpatient Medications  Medication Sig Dispense Refill  . ASPIRIN ADULT LOW STRENGTH 81 MG EC tablet Take 1 tablet (81 mg total) by mouth daily. 30 tablet 0  . colesevelam (WELCHOL) 625 MG tablet Take 2  tablets (1,250 mg total) by mouth 2 (two) times daily with a meal. 120 tablet 0  . [facility] ASPIRIN LOW DOSE 81 MG EC tablet Take 81 mg by mouth daily.    . famotidine (PEPCID) 20 MG tablet Take 1 tablet (20 mg total) by mouth 2 (two) times daily. 60 tablet 2  . fluticasone (FLONASE) 50 MCG/ACT nasal spray Place 1 spray into both nostrils at bedtime.    . hydrochlorothiazide (HYDRODIURIL) 25 MG tablet TAKE 1 TABLET BY MOUTH EVERY DAY 30 tablet 0  . isosorbide mononitrate (IMDUR) 30 MG 24 hr tablet Take 1 tablet (30 mg total) by mouth daily. 30 tablet 5  . losartan (COZAAR) 50 MG tablet TAKE 1 TABLET BY MOUTH EVERY DAY 30 tablet 0  . metoprolol tartrate (LOPRESSOR) 25 MG tablet TAKE 1 TABLET BY MOUTH TWICE A DAY 60 tablet 0  . omeprazole (PRILOSEC) 40 MG capsule TAKE 1 CAPSULE (40 MG TOTAL) BY MOUTH 2 (TWO) TIMES DAILY AT LEAST 30 MINUTES BEFORE A MEAL. 180 capsule 1   No current facility-administered medications for this visit.    Allergies Allergies  Allergen Reactions  . Lipitor [Atorvastatin] Other (See Comments)    Elevated LFTs, elevated CK with atorvastatin  . Lisinopril Cough    Histories Past Medical History:  Diagnosis Date  . Allergy   . Asthma   . COVID-19 09/2019  . Elevated CK 12/02/2019  . Elevated LFTs 12/02/2019  . GERD (gastroesophageal reflux disease)   . Hyperlipidemia   . Hypertension   . Sleep apnea    wears CPAP  . Sleep apnea with use of continuous positive airway pressure (CPAP)    Past Surgical History:  Procedure Laterality Date  .  NO PAST SURGERIES     Social History   Socioeconomic History  . Marital status: Married    Spouse name: Christian Escobar  . Number of children: 0  . Years of education: 62  . Highest education level: Not on file  Occupational History  . Occupation: Administrator, sports  Tobacco Use  . Smoking status: Never Smoker  . Smokeless tobacco: Never Used  Vaping Use  . Vaping Use: Never used  Substance and Sexual Activity  .  Alcohol use: No  . Drug use: No  . Sexual activity: Yes    Partners: Female    Comment: married  Other Topics Concern  . Not on file  Social History Narrative   Married to Christian Escobar. No children.    BS. Div. Chief Financial Officer.    Drinks caffeine.    Wears seatbelt, bicycle helmet. Smoke detector in the home.    Exercises 3x a week.    Firearms locked in the home.    Feels safe in his relationships.    Social Determinants of Health   Financial Resource Strain: Not on file  Food Insecurity: Not on file  Transportation Needs: Not on file  Physical Activity: Not on file  Stress: Not on file  Social Connections: Not on file  Intimate Partner Violence: Not on file   Family History  Problem Relation Age of Onset  . Hypertension Father   . Arthritis Father   . GER disease Father   . Colon cancer Neg Hx   . Esophageal cancer Neg Hx   . Pancreatic cancer Neg Hx   . Rectal cancer Neg Hx   . Stomach cancer Neg Hx    I have reviewed his medical, social, and family history in detail and updated the electronic medical record as necessary.    PHYSICAL EXAMINATION  BP 126/68   Pulse 74   Ht 5\' 9"  (1.753 m)   Wt 274 lb (124.3 kg)   BMI 40.46 kg/m  Wt Readings from Last 3 Encounters:  02/22/21 274 lb (124.3 kg)  02/05/21 271 lb (122.9 kg)  01/07/21 291 lb (132 kg)  GEN: NAD, appears stated age, doesn't appear chronically ill PSYCH: Cooperative, without pressured speech EYE: Conjunctivae pink, sclerae anicteric ENT: Masked CV: Nontachycardic RESP: No audible wheezing GI: NABS, soft, protuberant abdomen, nontender, without rebound MSK/EXT: Discomfort in the left lower rib and left flank region SKIN: No jaundice NEURO:  Alert & Oriented x 3, no focal deficits case   REVIEW OF DATA  I reviewed the following data at the time of this encounter:  GI Procedures and Studies  Previously reviewed Laboratory Studies  Reviewed those in epic  Imaging Studies  No new relevant  studies to review   ASSESSMENT  Mr. Christian Escobar is a 45 y.o. male with a pmh significant for asthma, hypertension, OSA, GERD, H. pylori gastritis (status post eradication).  The patient is seen today for evaluation and management of:  1. Left flank pain   2. Gastroesophageal reflux disease, unspecified whether esophagitis present   3. Musculoskeletal disorder   4. Lower rib pain    The patient is hemodynamically stable.  Clinically he has had some progressive issues while trying to down titrate his PPI.  I have asked him to try to take his medication 30 minutes before meals.  He will try to down titrate medication over the course of the full coming months.  For now he will stay where he is.  Over time, patient may decide  that he wants to come off PPI therapy at which point we can talk further about other potential options including fundoplication or TIF or [place].  The patient still has discomfort in the left upper abdomen left flank region.  Extensive work-up is most suggestive of a lower rib pain and less likely abdominal wall pain (due to the lateral nature) and even less likely for intra-abdominal processes.  We will work on trying to find a patient referral most likely to anesthesia/pain clinic to see if they may be able to offer the patient an intercostal nerve block versus a less likely abdominal wall trigger point injection but I think this will be the most helpful for the patient in an effort of trying to help his continued discomfort as treatment for his gastritis and heartburn has made no difference while negative other work-up has been found up-to-date at this time.  Patient would like to follow-up on an as-needed basis.  At age 75 he will begin colon cancer screening.  All patient questions were answered to the best of my ability, and the patient agrees to the aforementioned plan of action with follow-up as indicated.   PLAN  May continue omeprazole 40 mg twice daily - In June consider  omeprazole 40 mg once daily or decreasing to 20 mg twice daily - If does well then we can send a prescription (over-the-counter okay) Biannual LFTs is recommended to be obtained due to prior history of what was suggestive of abnormal LFTs and hepatic steatosis (although most recent CT without significant changes) Referral to pain clinic versus sports medicine clinic for consideration of intercostal nerve injection versus less likely abdominal wall trigger point injection Follow-up as needed Colonoscopy at age 50   No orders of the defined types were placed in this encounter.   New Prescriptions   No medications on file   Modified Medications   No medications on file    Planned Follow Up No follow-ups on file.   Total Time in Face-to-Face and in Coordination of Care for patient including independent/personal interpretation/review of prior testing, medical history, examination, medication adjustment, communicating results with the patient directly, and documentation with the EHR is 25 minutes.   Corliss Parish, MD [facility]oscopy Office # [phone]

## 2021-02-22 NOTE — Patient Instructions (Addendum)
Continue Omeprazole 40mg  once daily for 1 month, then decrease to 20mg  once daily. When decreasing to 20mg  once daily you may purchase it over the counter.   Let know how your doing on the 20mg  once daily dosing.   We will contact you regarding referral for abdomen wall injection. If you have not heard from in 1 week, please contact office at [phone] ask for Zaryia Markel.   You will be due for a recall colonoscopy in 2 years. We will send you a reminder in the mail when it gets closer to that time.   Follow up as needed.   Thank you for choosing me and [facility].  Dr. [place]

## 2021-02-27 ENCOUNTER — Encounter: Payer: Self-pay | Admitting: Gastroenterology

## 2021-02-27 DIAGNOSIS — R0781 Pleurodynia: Secondary | ICD-10-CM | POA: Insufficient documentation

## 2021-02-27 DIAGNOSIS — M799 Soft tissue disorder, unspecified: Secondary | ICD-10-CM | POA: Insufficient documentation

## 2021-02-28 ENCOUNTER — Encounter: Payer: Self-pay | Admitting: Family Medicine

## 2021-03-01 ENCOUNTER — Other Ambulatory Visit: Payer: Self-pay

## 2021-03-01 DIAGNOSIS — M799 Soft tissue disorder, unspecified: Secondary | ICD-10-CM

## 2021-03-01 DIAGNOSIS — R109 Unspecified abdominal pain: Secondary | ICD-10-CM

## 2021-03-01 MED ORDER — HYDROCHLOROTHIAZIDE 25 MG PO TABS: 1.0000 | ORAL_TABLET | Freq: Every day | ORAL | 0 refills | Status: DC

## 2021-03-01 NOTE — Addendum Note (Signed)
Addended by: Maxie Barb on: 03/01/2021 11:10 AM   Modules accepted: Orders

## 2021-03-01 NOTE — Progress Notes (Signed)
Referral Faxed to Dr. Benjamin Stain office.

## 2021-03-08 ENCOUNTER — Ambulatory Visit: Payer: 59 | Admitting: Family Medicine

## 2021-03-23 ENCOUNTER — Other Ambulatory Visit: Payer: Self-pay | Admitting: Family Medicine

## 2021-03-26 ENCOUNTER — Ambulatory Visit: Payer: 59 | Admitting: Cardiology

## 2021-03-29 ENCOUNTER — Other Ambulatory Visit: Payer: Self-pay

## 2021-03-29 ENCOUNTER — Encounter: Payer: Self-pay | Admitting: Family Medicine

## 2021-03-29 ENCOUNTER — Ambulatory Visit: Payer: 59 | Admitting: Family Medicine

## 2021-03-29 VITALS — BP 120/66 | HR 55 | Temp 97.9°F | Ht 69.0 in | Wt 276.0 lb

## 2021-03-29 DIAGNOSIS — Z789 Other specified health status: Secondary | ICD-10-CM

## 2021-03-29 DIAGNOSIS — E782 Mixed hyperlipidemia: Secondary | ICD-10-CM | POA: Diagnosis not present

## 2021-03-29 DIAGNOSIS — M791 Myalgia, unspecified site: Secondary | ICD-10-CM | POA: Insufficient documentation

## 2021-03-29 DIAGNOSIS — R7303 Prediabetes: Secondary | ICD-10-CM | POA: Diagnosis not present

## 2021-03-29 DIAGNOSIS — Z9989 Dependence on other enabling machines and devices: Secondary | ICD-10-CM

## 2021-03-29 DIAGNOSIS — I10 Essential (primary) hypertension: Secondary | ICD-10-CM

## 2021-03-29 DIAGNOSIS — G4733 Obstructive sleep apnea (adult) (pediatric): Secondary | ICD-10-CM | POA: Diagnosis not present

## 2021-03-29 DIAGNOSIS — R000 Tachycardia, unspecified: Secondary | ICD-10-CM

## 2021-03-29 DIAGNOSIS — Z6841 Body Mass Index (BMI) 40.0 and over, adult: Secondary | ICD-10-CM

## 2021-03-29 HISTORY — DX: Other specified health status: Z78.9

## 2021-03-29 HISTORY — DX: Morbid (severe) obesity due to excess calories: E66.01

## 2021-03-29 HISTORY — DX: Body Mass Index (BMI) 40.0 and over, adult: Z684

## 2021-03-29 LAB — COMPREHENSIVE METABOLIC PANEL
ALT: 22 U/L (ref 0–53)
AST: 17 U/L (ref 0–37)
Albumin: 4.8 g/dL (ref 3.5–5.2)
Alkaline Phosphatase: 60 U/L (ref 39–117)
BUN: 21 mg/dL (ref 6–23)
CO2: 29 mEq/L (ref 19–32)
Calcium: 10 mg/dL (ref 8.4–10.5)
Chloride: 100 mEq/L (ref 96–112)
Creatinine, Ser: 1.09 mg/dL (ref 0.40–1.50)
GFR: 83.28 mL/min (ref >60.00)
Glucose, Bld: 78 mg/dL (ref 70–99)
Potassium: 3.9 mEq/L (ref 3.5–5.1)
Sodium: 139 mEq/L (ref 135–145)
Total Bilirubin: 0.9 mg/dL (ref 0.2–1.2)
Total Protein: 7.3 g/dL (ref 6.0–8.3)

## 2021-03-29 LAB — LIPID PANEL
Cholesterol: 193 mg/dL (ref 0–200)
HDL: 33 mg/dL — ABNORMAL LOW (ref >39.00)
LDL Cholesterol: 131 mg/dL — ABNORMAL HIGH (ref 0–99)
NonHDL: 159.54
Total CHOL/HDL Ratio: 6
Triglycerides: 144 mg/dL (ref 0.0–149.0)
VLDL: 28.8 mg/dL (ref 0.0–40.0)

## 2021-03-29 LAB — TSH: TSH: 2.37 u[IU]/mL (ref 0.35–4.50)

## 2021-03-29 LAB — HEMOGLOBIN A1C: Hgb A1c MFr Bld: 5.6 % (ref 4.6–6.5)

## 2021-03-29 MED ORDER — COLESEVELAM HCL 625 MG PO TABS: 1250.0000 mg | ORAL_TABLET | Freq: Two times a day (BID) | ORAL | 2 refills | Status: DC

## 2021-03-29 MED ORDER — HYDROCHLOROTHIAZIDE 25 MG PO TABS: 1.0000 | ORAL_TABLET | Freq: Every day | ORAL | 1 refills | Status: DC

## 2021-03-29 MED ORDER — LOSARTAN POTASSIUM 50 MG PO TABS: 1.0000 | ORAL_TABLET | Freq: Every day | ORAL | 1 refills | Status: DC

## 2021-03-29 MED ORDER — ASPIRIN ADULT LOW STRENGTH 81 MG PO TBEC: 81.0000 mg | DELAYED_RELEASE_TABLET | Freq: Every day | ORAL | 3 refills | Status: DC

## 2021-03-29 MED ORDER — METOPROLOL TARTRATE 25 MG PO TABS: 25.0000 mg | ORAL_TABLET | Freq: Two times a day (BID) | ORAL | 1 refills | Status: DC

## 2021-03-29 NOTE — Progress Notes (Signed)
This visit occurred during the SARS-CoV-2 public health emergency.  Safety protocols were in place, including screening questions prior to the visit, additional usage of staff PPE, and extensive cleaning of exam room while observing appropriate contact time as indicated for disinfecting solutions.    Christian Escobar , 1975-11-29, 45 y.o., male MRN: [id number] Patient Care Team    Relationship Specialty Notifications Start End  Christian Leatherwood, DO PCP - General Family Medicine  09/01/16   Christian Fus., MD Referring Physician Cardiology  12/01/19     Chief Complaint  Patient presents with   Hypertension    Cmc; pt is fasting     Subjective: Christian Escobar is a 45 y.o. male present for [facility] Hypertension/Severe -morbidobesity/hyperlipidemia/tachy: Pt reports compliance  with metoprolol 25 mg twice daily, HCTZ 25 mg daily, Imdur 30 mg daily and Cozaar 50 mg daily. Blood pressures ranges at home than normal limits.Patient denies chest pain, shortness of breath, dizziness or lower extremity edema.  Pt takes a daily baby ASA.  Patient is statin intolerant-he has been switched over to [facility] and tolerating.   Prediabetes: He has gained weight since last appt. He is on the road a great deal with his job and had difficulty with choosing healthier food options d/t availability.   Depression screen [facility] 2/9 09/19/2020 11/18/2019 09/29/2016 09/01/2016  Decreased Interest 0 0 0 0  Down, Depressed, Hopeless 0 0 0 0  PHQ - 2 Score 0 0 0 0    Allergies  Allergen Reactions   Lipitor [Atorvastatin] Other (See Comments)    Elevated LFTs, elevated CK with atorvastatin   Lisinopril Cough   Social History   Social History Narrative   Married to [name]. No children.    BS. Div. Chief Financial Officer.    Drinks caffeine.    Wears seatbelt, bicycle helmet. Smoke detector in the home.    Exercises 3x a week.    Firearms locked in the home.    Feels safe in his relationships.    Past  Medical History:  Diagnosis Date   Allergy    Asthma    COVID-19 09/2019   Elevated CK 12/02/2019   Elevated LFTs 12/02/2019   GERD (gastroesophageal reflux disease)    Hyperlipidemia    Hypertension    Sleep apnea    wears CPAP   Sleep apnea with use of continuous positive airway pressure (CPAP)    Past Surgical History:  Procedure Laterality Date   NO PAST SURGERIES     Family History  Problem Relation Age of Onset   Hypertension Father    Arthritis Father    GER disease Father    Colon cancer Neg Hx    Esophageal cancer Neg Hx    Pancreatic cancer Neg Hx    Rectal cancer Neg Hx    Stomach cancer Neg Hx    Allergies as of 03/29/2021       Reactions   Lipitor [atorvastatin] Other (See Comments)   Elevated LFTs, elevated CK with atorvastatin   Lisinopril Cough        Medication List        Accurate as of March 29, 2021 11:10 AM. If you have any questions, ask your nurse or doctor.          Aspirin Adult Low Strength 81 MG EC tablet Generic drug: aspirin Take 1 tablet (81 mg total) by mouth daily.   [facility] Aspirin Low Dose 81 MG EC tablet Generic drug:  aspirin Take 81 mg by mouth daily.   colesevelam 625 MG tablet Commonly known as: Welchol Take 2 tablets (1,250 mg total) by mouth 2 (two) times daily with a meal.   famotidine 20 MG tablet Commonly known as: PEPCID Take 1 tablet (20 mg total) by mouth 2 (two) times daily.   fluticasone 50 MCG/ACT nasal spray Commonly known as: FLONASE Place 1 spray into both nostrils at bedtime.   hydrochlorothiazide 25 MG tablet Commonly known as: HYDRODIURIL Take 1 tablet (25 mg total) by mouth daily.   isosorbide mononitrate 30 MG 24 hr tablet Commonly known as: IMDUR Take 1 tablet (30 mg total) by mouth daily.   losartan 50 MG tablet Commonly known as: COZAAR TAKE 1 TABLET BY MOUTH EVERY DAY   metoprolol tartrate 25 MG tablet Commonly known as: LOPRESSOR TAKE 1 TABLET BY MOUTH TWICE A DAY   omeprazole 40  MG capsule Commonly known as: PRILOSEC TAKE 1 CAPSULE (40 MG TOTAL) BY MOUTH 2 (TWO) TIMES DAILY AT LEAST 30 MINUTES BEFORE A MEAL.        All past medical history, surgical history, allergies, family history, immunizations andmedications were updated in the EMR today and reviewed under the history and medication portions of their EMR.     ROS: Negative, with the exception of above mentioned in HPI   Objective:  BP 120/66   Pulse (!) 55   Temp 97.9 F (36.6 C) (Oral)   Ht 5\' 9"  (1.753 m)   Wt 276 lb (125.2 kg)   SpO2 99%   BMI 40.76 kg/m  Body mass index is 40.76 kg/m. Gen: Afebrile. No acute distress. Nontoxic, pleasant obese male.  HENT: AT. [place]. No cough or shortness of breath.  Eyes:Pupils Equal Round Reactive to light, Extraocular movements intact,  Conjunctiva without redness, discharge or icterus. Neck/lymp/endocrine: Supple,no lymphadenopathy, no thyromegaly CV: RRR no murmur, no edema, +2/4 P posterior tibialis pulses Chest: CTAB, no wheeze or crackles Skin: no rashes, purpura or petechiae.  Neuro:  Normal gait. PERLA. EOMi. Alert. Oriented x3 Psych: Normal affect, dress and demeanor. Normal speech. Normal thought content and judgment.  No results found. No results found. No results found for this or any previous visit (from the past 24 hour(s)).  Assessment/Plan: Christian Escobar is a 45 y.o. male present for OV for  Essential hypertension, benign/Severe obesity (BMI >= 40) (HCC) Stable.  Continue the following - HCTZ 25 mg QD -Losartan 50 mg QD  - Metoprolol 25 mg BID DISCONTINUE - imdur 30. If chest discomfort returns in 2 weeks after stopping, then restart and let 55 know so we can refill. This had been added by an UC - atypical chest pain has been resolved with CPAP pressure lowering.  Continue welchol. Statin intolerant.  Diet/exercise Cmp, tsh, lipids collected today.   OSA compliant with CPAP and follows with pulmonology GERD Follows with  gastroenterology now for condition. Symptoms are greatly improved and they have recently back down to omeprazole once daily.  Reviewed expectations re: course of current medical issues. Discussed self-management of symptoms. Outlined signs and symptoms indicating need for more acute intervention. Patient verbalized understanding and all questions were answered. Patient received an After-Visit Summary.    No orders of the defined types were placed in this encounter.  No orders of the defined types were placed in this encounter.  Referral Orders  No referral(s) requested today     Note is dictated utilizing voice recognition software. Although note has been proof read  prior to signing, occasional typographical errors still can be missed. If any questions arise, please do not hesitate to call for verification.   electronically signed by:  Howard Pouch, DO  Reader

## 2021-03-29 NOTE — Patient Instructions (Signed)
Great to see you today.  I have refilled the medication(s) we provide.   If labs were collected, we will inform you of lab results once received either by echart message or telephone call.   - echart message- for normal results that have been seen by the patient already.   - telephone call: abnormal results or if patient has not viewed results in their echart.  

## 2021-04-23 ENCOUNTER — Ambulatory Visit: Payer: 59 | Admitting: Cardiology

## 2021-05-31 ENCOUNTER — Ambulatory Visit: Payer: 59 | Admitting: Cardiology

## 2021-06-17 IMAGING — CT CT CHEST W/O CM
2 of 4 series · 15 of 36 positions shown, 18 images · non-contrast
Comparison: Chest radiograph, 07/12/2020

CLINICAL DATA: Shortness of breath, abnormal chest radiograph

EXAM:
CT CHEST WITHOUT CONTRAST
TECHNIQUE: Multidetector CT imaging of the chest was performed following the
standard protocol without IV contrast.

[Series 2: thorax · axial · 0.98mm/px · z∈[+1055,+1349]mm · 12 of 175 slices shown, 15 images]
[im 14/175  mediastinal]
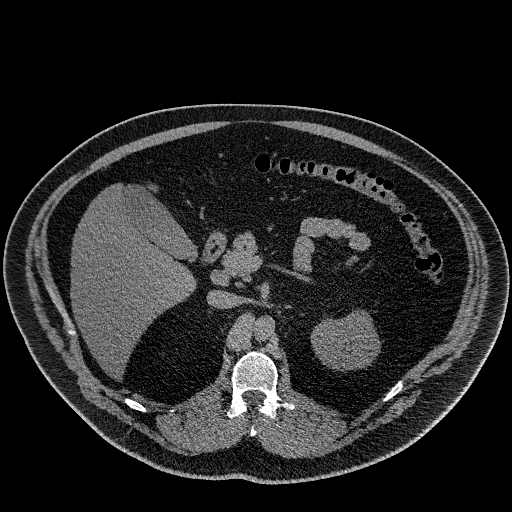
[im 14/175  lung]
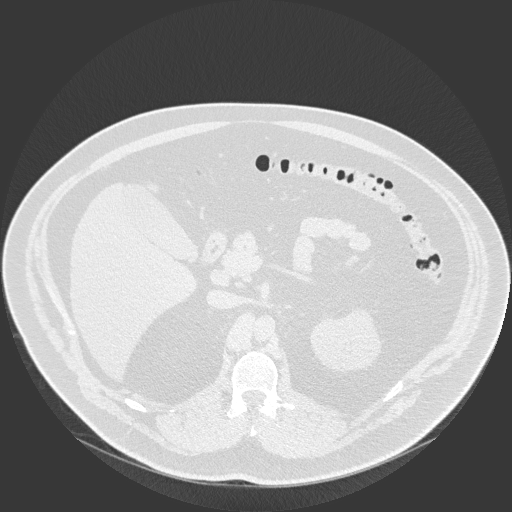
[im 27/175  lung]
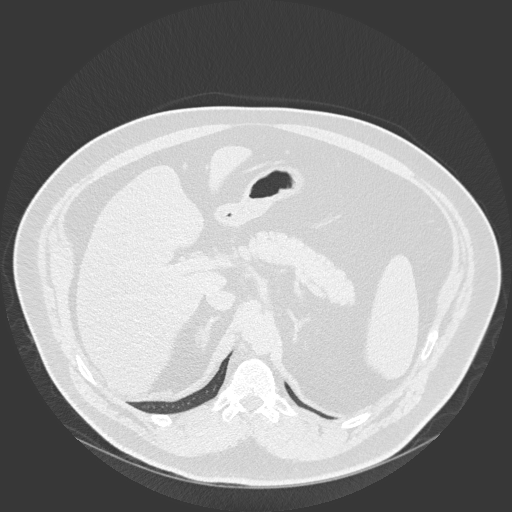
[im 41/175  lung]
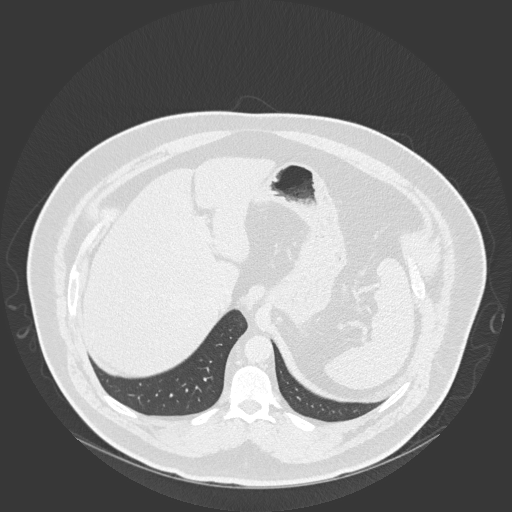
[im 54/175  lung]
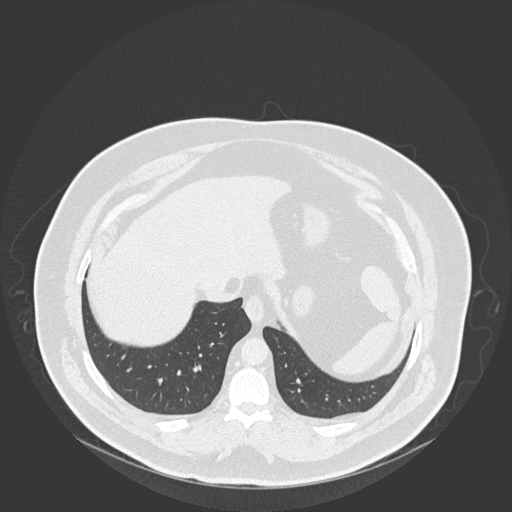
[im 67/175  mediastinal]
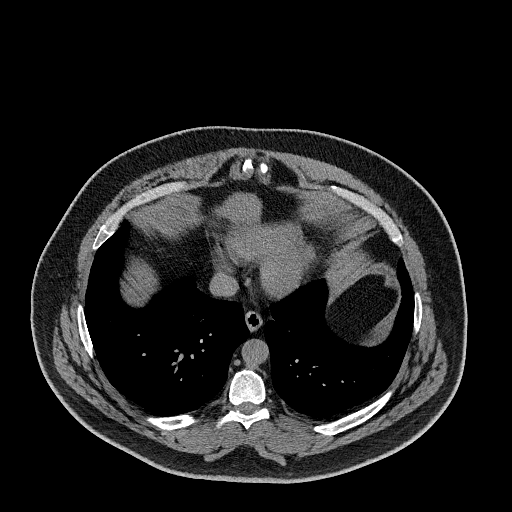
[im 67/175  lung]
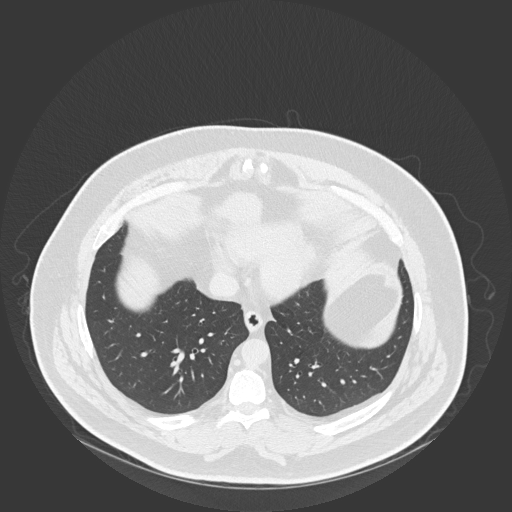
[im 81/175  lung]
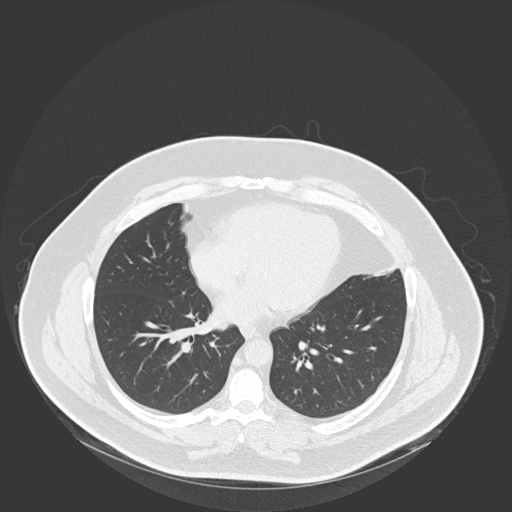
[im 94/175  lung]
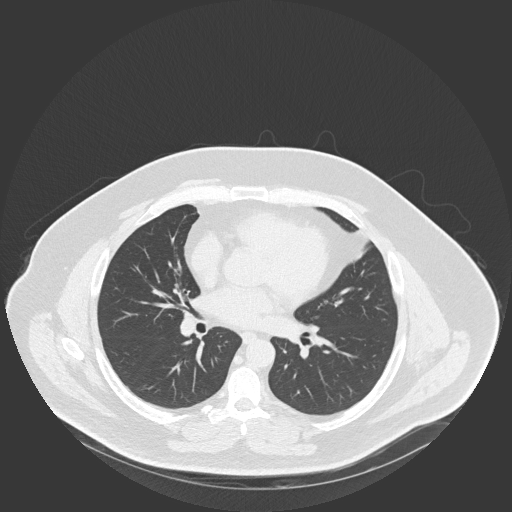
[im 108/175  lung]
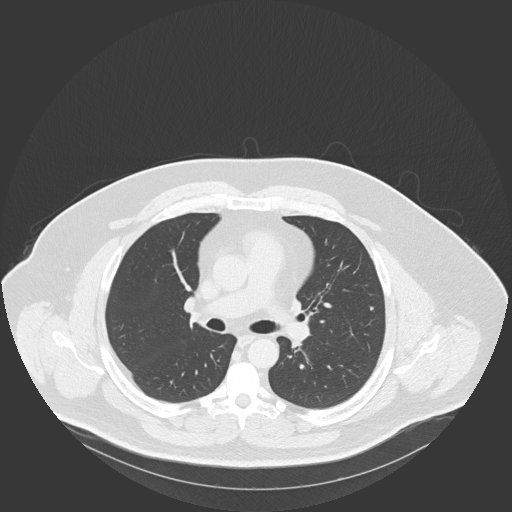
[im 121/175  mediastinal]
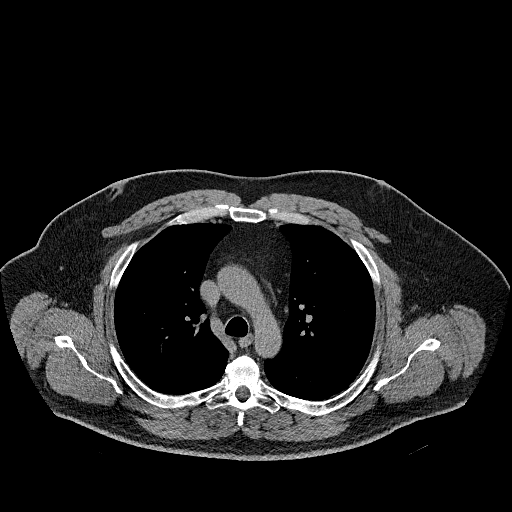
[im 121/175  lung]
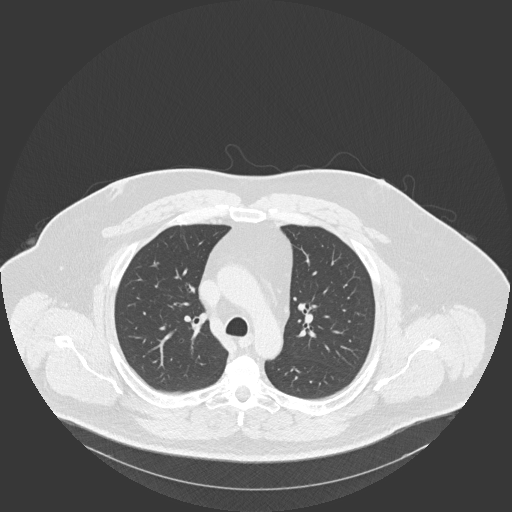
[im 134/175  lung]
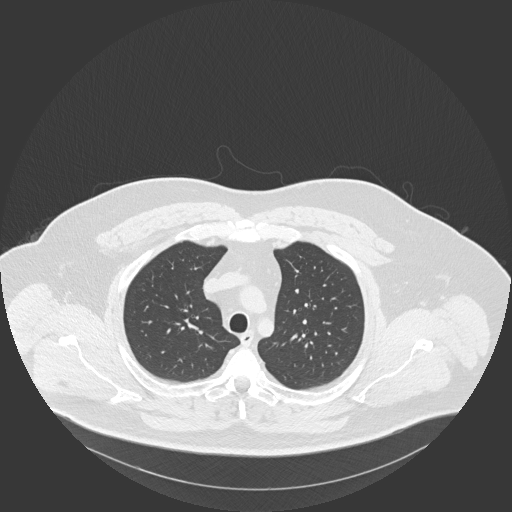
[im 148/175  lung]
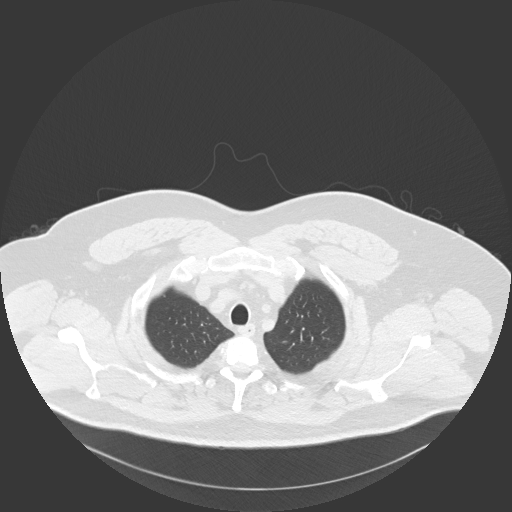
[im 161/175  lung]
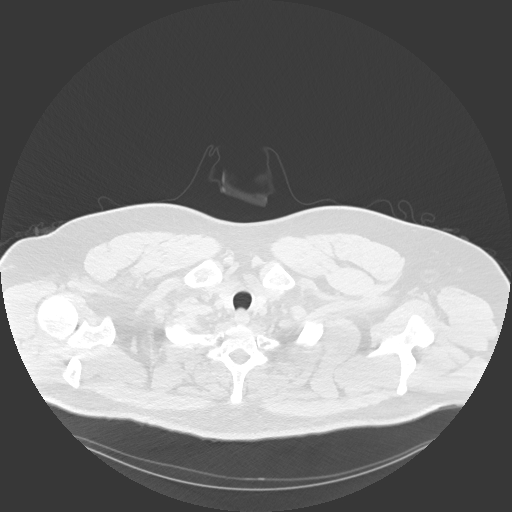

[Series 5: coronal · coronal · 0.68mm/px · 3 of 160 slices shown]
[im 32/160  lung]
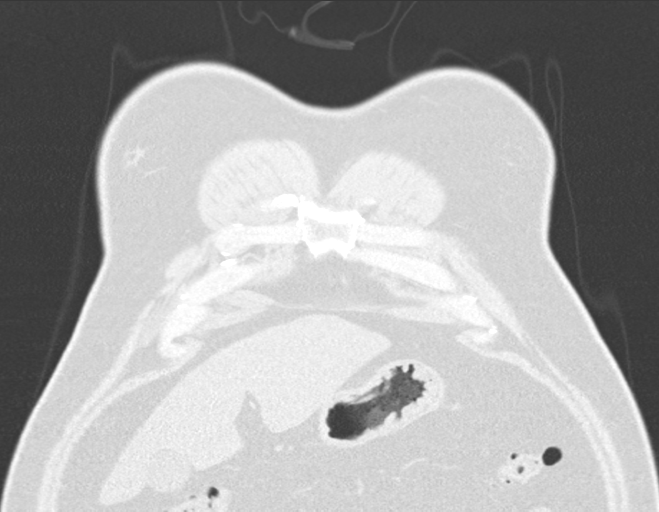
[im 64/160  lung]
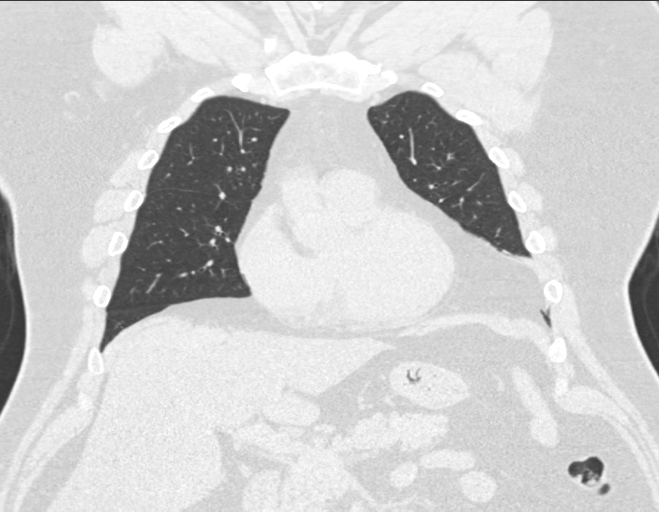
[im 96/160  lung]
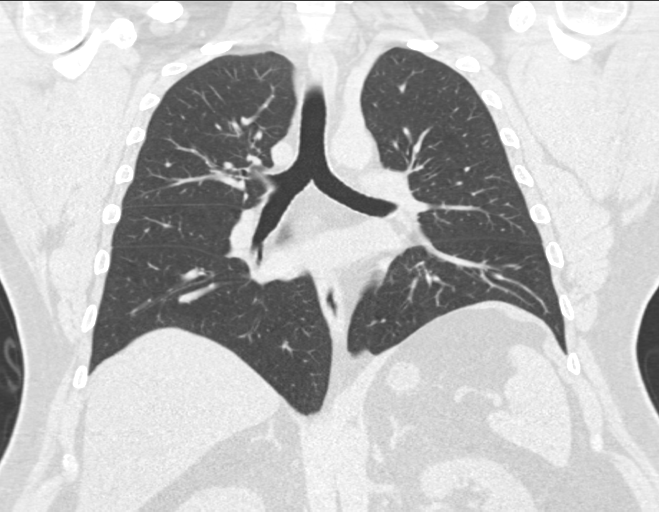

[15 of 36 positions shown; findings below may reference images not displayed]

FINDINGS: Cardiovascular: No significant vascular findings. Normal heart size.
No pericardial effusion.

Mediastinum/Nodes: No enlarged mediastinal, hilar, or axillary lymph
nodes. Thyroid gland, trachea, and esophagus demonstrate no
significant findings.

Lungs/Pleura: Occasional small pulmonary nodules, for example a 5 mm
pulmonary nodule of the left apex (series 3, image 26) and a 5 mm
pulmonary nodule of the anterior right upper lobe (series 3, image
65). No pleural effusion or pneumothorax.

Upper Abdomen: No acute abnormality.  Hepatic steatosis.

Musculoskeletal: No chest wall mass or suspicious bone lesions
identified.
IMPRESSION: 1. No acute abnormality of the chest. No CT findings to explain
shortness of breath.
2. Occasional small pulmonary nodules measuring 5 mm or smaller. No
follow-up needed if patient is low-risk (and has no known or
suspected primary neoplasm). Non-contrast chest CT can be considered
in 12 months if patient is high-risk. This recommendation follows
the consensus statement: Guidelines for Management of Incidental
Pulmonary Nodules Detected on CT Images: From the [HOSPITAL]
3. Hepatic steatosis.

## 2021-07-22 ENCOUNTER — Ambulatory Visit (INDEPENDENT_AMBULATORY_CARE_PROVIDER_SITE_OTHER)
Admission: RE | Admit: 2021-07-22 | Discharge: 2021-07-22 | Disposition: A | Discharge status: Home / Self Care | Payer: 59 | Source: Ambulatory Visit | Attending: Pulmonary Disease | Admitting: Pulmonary Disease

## 2021-07-22 ENCOUNTER — Other Ambulatory Visit: Payer: Self-pay

## 2021-07-22 DIAGNOSIS — R911 Solitary pulmonary nodule: Secondary | ICD-10-CM | POA: Diagnosis not present

## 2021-07-25 ENCOUNTER — Ambulatory Visit: Payer: 59 | Admitting: Cardiology

## 2021-08-07 ENCOUNTER — Other Ambulatory Visit: Payer: Self-pay

## 2021-08-07 ENCOUNTER — Encounter: Payer: Self-pay | Admitting: Hematology & Oncology

## 2021-08-07 ENCOUNTER — Inpatient Hospital Stay: Payer: 59 | Admitting: Hematology & Oncology

## 2021-08-07 ENCOUNTER — Inpatient Hospital Stay: Payer: 59 | Attending: Hematology & Oncology

## 2021-08-07 ENCOUNTER — Telehealth: Payer: Self-pay

## 2021-08-07 ENCOUNTER — Ambulatory Visit (HOSPITAL_BASED_OUTPATIENT_CLINIC_OR_DEPARTMENT_OTHER)
Admission: RE | Admit: 2021-08-07 | Discharge: 2021-08-07 | Disposition: A | Discharge status: Home / Self Care | Payer: 59 | Source: Ambulatory Visit | Attending: Hematology & Oncology | Admitting: Hematology & Oncology

## 2021-08-07 ENCOUNTER — Encounter (HOSPITAL_BASED_OUTPATIENT_CLINIC_OR_DEPARTMENT_OTHER): Payer: Self-pay

## 2021-08-07 VITALS — BP 141/78 | HR 56 | Temp 98.6°F | Resp 16 | Ht 69.0 in | Wt 280.0 lb

## 2021-08-07 DIAGNOSIS — R590 Localized enlarged lymph nodes: Secondary | ICD-10-CM | POA: Insufficient documentation

## 2021-08-07 DIAGNOSIS — K760 Fatty (change of) liver, not elsewhere classified: Secondary | ICD-10-CM | POA: Diagnosis not present

## 2021-08-07 DIAGNOSIS — R599 Enlarged lymph nodes, unspecified: Secondary | ICD-10-CM | POA: Insufficient documentation

## 2021-08-07 LAB — CMP (CANCER CENTER ONLY)
ALT: 33 U/L (ref 0–44)
AST: 22 U/L (ref 15–41)
Albumin: 4.7 g/dL (ref 3.5–5.0)
Alkaline Phosphatase: 56 U/L (ref 38–126)
Anion gap: 11 (ref 5–15)
BUN: 16 mg/dL (ref 6–20)
CO2: 29 mmol/L (ref 22–32)
Calcium: 10.3 mg/dL (ref 8.9–10.3)
Chloride: 102 mmol/L (ref 98–111)
Creatinine: 1.08 mg/dL (ref 0.61–1.24)
GFR, Estimated: >60 mL/min (ref >60)
Glucose, Bld: 91 mg/dL (ref 70–99)
Potassium: 4 mmol/L (ref 3.5–5.1)
Sodium: 142 mmol/L (ref 135–145)
Total Bilirubin: 0.9 mg/dL (ref 0.3–1.2)
Total Protein: 7.5 g/dL (ref 6.5–8.1)

## 2021-08-07 LAB — CBC WITH DIFFERENTIAL (CANCER CENTER ONLY)
Abs Immature Granulocytes: 0.03 10*3/uL (ref 0.00–0.07)
Basophils Absolute: 0 10*3/uL (ref 0.0–0.1)
Basophils Relative: 0 %
Eosinophils Absolute: 0.2 10*3/uL (ref 0.0–0.5)
Eosinophils Relative: 2 %
HCT: 44.6 % (ref 39.0–52.0)
Hemoglobin: 15 g/dL (ref 13.0–17.0)
Immature Granulocytes: 0 %
Lymphocytes Relative: 29 %
Lymphs Abs: 2.1 10*3/uL (ref 0.7–4.0)
MCH: 29.9 pg (ref 26.0–34.0)
MCHC: 33.6 g/dL (ref 30.0–36.0)
MCV: 88.8 fL (ref 80.0–100.0)
Monocytes Absolute: 0.8 10*3/uL (ref 0.1–1.0)
Monocytes Relative: 10 %
Neutro Abs: 4.4 10*3/uL (ref 1.7–7.7)
Neutrophils Relative %: 59 %
Platelet Count: 301 10*3/uL (ref 150–400)
RBC: 5.02 MIL/uL (ref 4.22–5.81)
RDW: 13.1 % (ref 11.5–15.5)
WBC Count: 7.5 10*3/uL (ref 4.0–10.5)
nRBC: 0 % (ref 0.0–0.2)

## 2021-08-07 LAB — LACTATE DEHYDROGENASE: LDH: 182 U/L (ref 98–192)

## 2021-08-07 MED ORDER — IOHEXOL 300 MG/ML  SOLN
100.0000 mL | Freq: Once | INTRAMUSCULAR | Status: AC | PRN
  Administered 2021-08-07: 100 mL via INTRAVENOUS

## 2021-08-07 NOTE — Telephone Encounter (Signed)
Called and informed patient of scan results, patient verbalized understanding and denies any questions or concerns at this time.   

## 2021-08-07 NOTE — Progress Notes (Signed)
Hematology and Oncology Follow Up Visit  Christian Escobar [id number] Feb 14, 1976 45 y.o. 08/07/2021   Principle Diagnosis:  Retroperitoneal adenopathy  Current Therapy:   Observation     Interim History:  Christian Escobar is back for second office visit.  We first saw him back in April.  At that time, he had mild adenopathy in the abdomen.  He was asymptomatic with this.  He did have a CT scan that was done 2 weeks ago of the chest.  This showed stability of bilateral pulmonary nodules.  No follow-up imaging was recommended.  He has had no problems working.  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  He has had no problems with fever.  There is no change in bowel or bladder habits.  He has had no issues with COVID.  He has had no rashes.  There is been no leg swelling.  He has had no cough or shortness of breath.  He has not noted any swollen lymph nodes himself.  Overall, his performance status is ECOG 0.  Medications:  Current Outpatient Medications:    ASPIRIN ADULT LOW STRENGTH 81 MG EC tablet, Take 1 tablet (81 mg total) by mouth daily., Disp: 90 tablet, Rfl: 3   colesevelam (WELCHOL) 625 MG tablet, Take 2 tablets (1,250 mg total) by mouth 2 (two) times daily with a meal., Disp: 360 tablet, Rfl: 2   hydrochlorothiazide (HYDRODIURIL) 25 MG tablet, Take 1 tablet (25 mg total) by mouth daily., Disp: 90 tablet, Rfl: 1   losartan (COZAAR) 50 MG tablet, Take 1 tablet (50 mg total) by mouth daily., Disp: 90 tablet, Rfl: 1   metoprolol tartrate (LOPRESSOR) 25 MG tablet, Take 1 tablet (25 mg total) by mouth 2 (two) times daily., Disp: 180 tablet, Rfl: 1   omeprazole (PRILOSEC) 40 MG capsule, TAKE 1 CAPSULE (40 MG TOTAL) BY MOUTH 2 (TWO) TIMES DAILY AT LEAST 30 MINUTES BEFORE A MEAL. (Patient taking differently: Take 40 mg by mouth every other day.), Disp: 180 capsule, Rfl: 1  Allergies:  Allergies  Allergen Reactions   Lipitor [Atorvastatin] Other (See  Comments)    Elevated LFTs, elevated CK with atorvastatin   Lisinopril Cough    Past Medical History, Surgical history, Social history, and Family History were reviewed and updated.  Review of Systems: Review of Systems  Constitutional: Negative.   HENT:  Negative.    Eyes: Negative.   Respiratory: Negative.    Cardiovascular: Negative.   Gastrointestinal: Negative.   Endocrine: Negative.   Genitourinary: Negative.    Musculoskeletal: Negative.   Skin: Negative.   Neurological: Negative.   Hematological: Negative.   Psychiatric/Behavioral: Negative.     Physical Exam:  height is 5\' 9"  (1.753 m) and weight is 280 lb (127 kg). His oral temperature is 98.6 F (37 C). His blood pressure is 141/78 (abnormal) and his pulse is 56 (abnormal). His respiration is 16 and oxygen saturation is 99%.   Wt Readings from Last 3 Encounters:  08/07/21 280 lb (127 kg)  03/29/21 276 lb (125.2 kg)  02/22/21 274 lb (124.3 kg)    Physical Exam Vitals reviewed.  HENT:     Head: Normocephalic and atraumatic.  Eyes:     Pupils: Pupils are equal, round, and reactive to light.  Cardiovascular:     Rate and Rhythm: Normal rate and regular rhythm.     Heart sounds: Normal heart sounds.  Pulmonary:     Effort: Pulmonary effort  is normal.     Breath sounds: Normal breath sounds.  Abdominal:     General: Bowel sounds are normal.     Palpations: Abdomen is soft.  Musculoskeletal:        General: No tenderness or deformity. Normal range of motion.     Cervical back: Normal range of motion.  Lymphadenopathy:     Cervical: No cervical adenopathy.  Skin:    General: Skin is warm and dry.     Findings: No erythema or rash.  Neurological:     Mental Status: He is alert and oriented to person, place, and time.  Psychiatric:        Behavior: Behavior normal.        Thought Content: Thought content normal.        Judgment: Judgment normal.     Lab Results  Component Value Date   WBC 7.5  08/07/2021   HGB 15.0 08/07/2021   HCT 44.6 08/07/2021   MCV 88.8 08/07/2021   PLT 301 08/07/2021     Chemistry      Component Value Date/Time   NA 142 08/07/2021 0840   K 4.0 08/07/2021 0840   CL 102 08/07/2021 0840   CO2 29 08/07/2021 0840   BUN 16 08/07/2021 0840   CREATININE 1.08 08/07/2021 0840   CREATININE 1.07 07/02/2020 1646      Component Value Date/Time   CALCIUM 10.3 08/07/2021 0840   ALKPHOS 56 08/07/2021 0840   AST 22 08/07/2021 0840   ALT 33 08/07/2021 0840   BILITOT 0.9 08/07/2021 0840      Impression and Plan: Christian Escobar is a very nice 45 year old male.  He has this minimal nondescript adenopathy that was seen on a past CT scan.  He is to have his CAT scan done today.  He will have this after we see him.  If there is no change in this adenopathy, then I think we can let him go from the clinic.  I just cannot imagine that his hepatopathy is anything malignant.  He looks great.  He has gained a little bit of weight.  He probably needs to lose a little bit of weight.  He is aware of this.  He is a lot of fun to talk to.  Again he has a very interesting job.   Christian Macho, MD 10/19/202211:39 AM

## 2021-08-07 NOTE — Telephone Encounter (Signed)
-----   Message from Josph Macho, MD sent at 08/07/2021  3:14 PM EDT ----- Please call and tell him that the lymph nodes are smaller in size.  As such, there is no way that they could be malignant.  Thankfully, we do not have to have him come back to our office.  He is very interesting to talk to.  His time is too viable for [place] to keep seeing him.  Cindee Lame

## 2021-08-08 ENCOUNTER — Telehealth: Payer: Self-pay | Admitting: Hematology & Oncology

## 2021-08-08 NOTE — Telephone Encounter (Signed)
No los 10/19 

## 2021-08-21 ENCOUNTER — Other Ambulatory Visit: Payer: Self-pay

## 2021-08-21 ENCOUNTER — Encounter: Payer: Self-pay | Admitting: Cardiology

## 2021-08-21 ENCOUNTER — Ambulatory Visit: Payer: 59 | Admitting: Cardiology

## 2021-08-21 VITALS — BP 112/64 | HR 70 | Ht 69.0 in | Wt 271.0 lb

## 2021-08-21 DIAGNOSIS — E782 Mixed hyperlipidemia: Secondary | ICD-10-CM | POA: Diagnosis not present

## 2021-08-21 DIAGNOSIS — Z9989 Dependence on other enabling machines and devices: Secondary | ICD-10-CM

## 2021-08-21 DIAGNOSIS — Z789 Other specified health status: Secondary | ICD-10-CM

## 2021-08-21 DIAGNOSIS — I10 Essential (primary) hypertension: Secondary | ICD-10-CM | POA: Diagnosis not present

## 2021-08-21 DIAGNOSIS — G4733 Obstructive sleep apnea (adult) (pediatric): Secondary | ICD-10-CM

## 2021-08-21 DIAGNOSIS — R7303 Prediabetes: Secondary | ICD-10-CM

## 2021-08-21 NOTE — Progress Notes (Signed)
Cardiology Office Note:    Date:  08/21/2021   ID:  Christian Escobar, DOB June 14, 1976, MRN [id number]  PCP:  Natalia Leatherwood, DO  Cardiologist:  Gypsy Balsam, MD    Referring MD: Natalia Leatherwood, DO   Chief Complaint  Patient presents with   Follow-up  Doing well  History of Present Illness:    Christian Escobar is a 45 y.o. male with past medical history significant for essential hypertension, obstructive sleep apnea, morbid obesity, atypical chest pain with calcium score of 0, prediabetes, statin intolerance.  He comes today to my office for follow-up overall he is doing better he exercise and regular basis he walks 5 miles every single day.  Denies having any chest pain tightness squeezing pressure burning chest P admit that he is slight cough somewhat with the diet and gained few pounds but not trying to get back on track and doing well.  Past Medical History:  Diagnosis Date   Allergy    Asthma    Atypical chest pain 12/12/2019   COVID-19 09/2019   Elevated CK 12/02/2019   Elevated LFTs 12/02/2019   Essential hypertension, benign 09/01/2016   Gastroesophageal reflux disease without esophagitis 09/10/2016   GERD (gastroesophageal reflux disease)    Globus sensation 12/30/2019   Hepatic steatosis 12/18/2020   History of Helicobacter infection 12/18/2020   Hyperlipidemia    Hypertension    Mixed hyperlipidemia 12/02/2019   Morbid obesity with BMI of 40.0-44.9, adult (HCC) 03/29/2021   OSA on CPAP 01/19/2017   Trial of CPAP therapy on 17 cm H2O with EPR 2, a Medium size [employer]kel Full Face Mask Simplus mask and heated humidification. - Avoid alcohol, sedatives and other CNS depressants that may worsen sleep apnea and disrupt normal sleep architecture. - Sleep hygiene should be reviewed to assess factors that may improve sleep quality. 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   colesevelam (WELCHOL) 625 MG tablet Take 2 tablets (1,250 mg total) by mouth 2 (two) times daily with a meal.   hydrochlorothiazide (HYDRODIURIL) 25 MG tablet Take 1 tablet (25 mg total) by mouth daily.   losartan (COZAAR) 50 MG tablet Take 1 tablet (50 mg total) by mouth daily.   metoprolol tartrate (LOPRESSOR) 25 MG tablet Take 1 tablet (25 mg total) by mouth 2 (two) times daily.   omeprazole (PRILOSEC) 40 MG capsule TAKE 1 CAPSULE (40 MG TOTAL) BY MOUTH 2 (TWO) TIMES DAILY AT LEAST 30 MINUTES BEFORE A MEAL. (Patient taking differently: Take 40 mg by mouth every other day.)     Allergies:   Lipitor [atorvastatin] and Lisinopril   Social History   Socioeconomic History   Marital status: Married    Spouse name: Natalia Leatherwood   Number of children: 0   Years of education: 16   Highest education level: Not on file  Occupational History   Occupation: Administrator, sports  Tobacco Use   Smoking status: Never  Smokeless tobacco: Never  Vaping Use   Vaping Use: Never used  Substance and Sexual Activity   Alcohol use: No   Drug use: No   Sexual activity: Yes    Partners: Female    Comment: married  Other Topics Concern   Not on file  Social History Narrative   Married to [place]. No children.    BS. Div. Chief Financial Officer.    Drinks caffeine.    Wears seatbelt, bicycle helmet. Smoke detector in the home.    Exercises 3x a week.    Firearms locked in the home.    Feels safe in his relationships.    Social Determinants of Health   Financial Resource Strain: Not on  file  Food Insecurity: Not on file  Transportation Needs: Not on file  Physical Activity: Not on file  Stress: Not on file  Social Connections: Not on file     Family History: The patient's family history includes Arthritis in his father; GER disease in his father; Hypertension in his father. There is no history of Colon cancer, Esophageal cancer, Pancreatic cancer, Rectal cancer, or Stomach cancer. ROS:   Please see the history of present illness.    All 14 point review of systems negative except as described per history of present illness  EKGs/Labs/Other Studies Reviewed:      Recent Labs: 03/29/2021: TSH 2.37 08/07/2021: ALT 33; BUN 16; Creatinine 1.08; Hemoglobin 15.0; Platelet Count 301; Potassium 4.0; Sodium 142  Recent Lipid Panel    Component Value Date/Time   CHOL 193 03/29/2021 1128   TRIG 144.0 03/29/2021 1128   HDL 33.00 (L) 03/29/2021 1128   CHOLHDL 6 03/29/2021 1128   VLDL 28.8 03/29/2021 1128   LDLCALC 131 (H) 03/29/2021 1128    Physical Exam:    VS:  BP 112/64   Pulse 70   Ht 5\' 9"  (1.753 m)   Wt 271 lb (122.9 kg)   SpO2 97%   BMI 40.02 kg/m     Wt Readings from Last 3 Encounters:  08/21/21 271 lb (122.9 kg)  08/07/21 280 lb (127 kg)  03/29/21 276 lb (125.2 kg)     GEN:  Well nourished, well developed in no acute distress HEENT: Normal NECK: No JVD; No carotid bruits LYMPHATICS: No lymphadenopathy CARDIAC: RRR, no murmurs, no rubs, no gallops RESPIRATORY:  Clear to auscultation without rales, wheezing or rhonchi  ABDOMEN: Soft, non-tender, non-distended MUSCULOSKELETAL:  No edema; No deformity  SKIN: Warm and dry LOWER EXTREMITIES: no swelling NEUROLOGIC:  Alert and oriented x 3 PSYCHIATRIC:  Normal affect   ASSESSMENT:    1. Essential hypertension, benign   2. OSA on CPAP   3. Statin intolerance   4. Mixed hyperlipidemia   5. Prediabetes    PLAN:    In order of problems listed above:  Chest pain denies having any continue  present management. Essential hypertension blood pressure well controlled Obstructive sleep apnea on CPAP mask follow-up by internal medicine team Dyslipidemia I will check his liver function test as well as fasting lipid profile.  Previously we tried to use statin however he did have elevation of liver function test which I suspect was related.  Fatty liver hopefully this time his course is better or if is not having to use some statin will be able to do that.  WelChol is already on board but that is not the best effective medication. Prediabetes: Diet and exercise   Medication Adjustments/Labs and Tests Ordered: Current medicines are reviewed  at length with the patient today.  Concerns regarding medicines are outlined above.  No orders of the defined types were placed in this encounter.  Medication changes: No orders of the defined types were placed in this encounter.   Signed, Georgeanna Lea, MD, [facility] 08/21/2021 3:16 PM    [facility]

## 2021-08-21 NOTE — Patient Instructions (Signed)
Medication Instructions:  Your physician recommends that you continue on your current medications as directed. Please refer to the Current Medication list given to you today.  *If you need a refill on your cardiac medications before your next appointment, please call your pharmacy*   Lab Work: Your physician recommends that you return for lab work in: Liver function and Lipid profile fasting tomorrow  If you have labs (blood work) drawn today and your tests are completely normal, you will receive your results only by: MyChart Message (if you have MyChart) OR A paper copy in the mail If you have any lab test that is abnormal or we need to change your treatment, we will call you to review the results.   Testing/Procedures: None   Follow-Up: At [facility], you and your health needs are our priority.  As part of our continuing mission to provide you with exceptional heart care, we have created designated Provider Care Teams.  These Care Teams include your primary Cardiologist (physician) and Advanced Practice Providers (APPs -  Physician Assistants and Nurse Practitioners) who all work together to provide you with the care you need, when you need it.  We recommend signing up for the patient portal called "MyChart".  Sign up information is provided on this After Visit Summary.  MyChart is used to connect with patients for Virtual Visits (Telemedicine).  Patients are able to view lab/test results, encounter notes, upcoming appointments, etc.  Non-urgent messages can be sent to your provider as well.   To learn more about what you can do with MyChart, go to [email or web address].    Your next appointment:   1 year(s)  The format for your next appointment:   In Person  Provider:   Gypsy Balsam, MD   Other Instructions

## 2021-09-06 ENCOUNTER — Ambulatory Visit: Payer: 59 | Admitting: Family Medicine

## 2021-09-20 ENCOUNTER — Other Ambulatory Visit: Payer: Self-pay | Admitting: Gastroenterology

## 2021-09-20 ENCOUNTER — Other Ambulatory Visit: Payer: Self-pay | Admitting: Family Medicine

## 2021-09-20 DIAGNOSIS — R000 Tachycardia, unspecified: Secondary | ICD-10-CM

## 2021-09-20 DIAGNOSIS — I10 Essential (primary) hypertension: Secondary | ICD-10-CM

## 2021-09-30 ENCOUNTER — Ambulatory Visit: Payer: 59 | Admitting: Family Medicine

## 2021-10-01 ENCOUNTER — Other Ambulatory Visit: Payer: Self-pay | Admitting: Family Medicine

## 2021-10-23 ENCOUNTER — Other Ambulatory Visit: Payer: Self-pay | Admitting: Family Medicine

## 2021-10-23 MED ORDER — HYDROCHLOROTHIAZIDE 25 MG PO TABS: 25.0000 mg | ORAL_TABLET | Freq: Every day | ORAL | 0 refills | Status: DC

## 2021-10-24 ENCOUNTER — Other Ambulatory Visit: Payer: Self-pay | Admitting: Family Medicine

## 2021-10-24 DIAGNOSIS — R000 Tachycardia, unspecified: Secondary | ICD-10-CM

## 2021-10-24 DIAGNOSIS — I10 Essential (primary) hypertension: Secondary | ICD-10-CM

## 2021-11-01 ENCOUNTER — Ambulatory Visit: Payer: 59 | Admitting: Family Medicine

## 2021-11-16 ENCOUNTER — Other Ambulatory Visit: Payer: Self-pay | Admitting: Family Medicine

## 2021-11-16 DIAGNOSIS — R000 Tachycardia, unspecified: Secondary | ICD-10-CM

## 2021-11-16 DIAGNOSIS — I10 Essential (primary) hypertension: Secondary | ICD-10-CM

## 2021-11-18 ENCOUNTER — Other Ambulatory Visit: Payer: Self-pay | Admitting: Family Medicine

## 2021-11-18 DIAGNOSIS — R000 Tachycardia, unspecified: Secondary | ICD-10-CM

## 2021-11-18 DIAGNOSIS — I10 Essential (primary) hypertension: Secondary | ICD-10-CM

## 2021-11-19 ENCOUNTER — Other Ambulatory Visit: Payer: Self-pay

## 2021-11-19 MED ORDER — HYDROCHLOROTHIAZIDE 25 MG PO TABS: 25.0000 mg | ORAL_TABLET | Freq: Every day | ORAL | 0 refills | Status: DC

## 2021-11-19 NOTE — Progress Notes (Unsigned)
09

## 2021-11-29 ENCOUNTER — Other Ambulatory Visit: Payer: Self-pay

## 2021-12-02 ENCOUNTER — Telehealth: Payer: Self-pay

## 2021-12-02 ENCOUNTER — Ambulatory Visit: Payer: 59 | Admitting: Family Medicine

## 2021-12-02 DIAGNOSIS — R000 Tachycardia, unspecified: Secondary | ICD-10-CM

## 2021-12-02 DIAGNOSIS — I10 Essential (primary) hypertension: Secondary | ICD-10-CM

## 2021-12-02 MED ORDER — HYDROCHLOROTHIAZIDE 25 MG PO TABS: 25.0000 mg | ORAL_TABLET | Freq: Every day | ORAL | 0 refills | Status: DC

## 2021-12-02 MED ORDER — METOPROLOL TARTRATE 25 MG PO TABS: 25.0000 mg | ORAL_TABLET | Freq: Two times a day (BID) | ORAL | 0 refills | Status: DC

## 2021-12-02 MED ORDER — LOSARTAN POTASSIUM 50 MG PO TABS: 50.0000 mg | ORAL_TABLET | Freq: Every day | ORAL | 0 refills | Status: DC

## 2021-12-02 NOTE — Telephone Encounter (Signed)
(  1) Patient called at 8:15 AM.  His wife tested positive for COVID yesterday, 12/01/21.   Patient rescheduled appt for 12/20/21 with Dr. Raoul Pitch.  (2) Patient states that he needs refill for blood pressure until appt. [facility]  losartan (COZAAR) 50 MG tablet [id number]    metoprolol tartrate (LOPRESSOR) 25 MG tablet [id number]

## 2021-12-02 NOTE — Telephone Encounter (Signed)
Rx sent. FYI routed to provider due appt cancellation.

## 2021-12-02 NOTE — Addendum Note (Signed)
Addended by: Howard Pouch A on: 12/02/2021 12:39 PM   Modules accepted: Orders

## 2021-12-02 NOTE — Telephone Encounter (Signed)
Refilled for him.  

## 2021-12-18 ENCOUNTER — Ambulatory Visit: Payer: 59 | Admitting: Family Medicine

## 2021-12-19 ENCOUNTER — Other Ambulatory Visit: Payer: Self-pay

## 2021-12-20 ENCOUNTER — Ambulatory Visit: Payer: 59 | Admitting: Family Medicine

## 2021-12-20 ENCOUNTER — Encounter: Payer: Self-pay | Admitting: Family Medicine

## 2021-12-20 DIAGNOSIS — R000 Tachycardia, unspecified: Secondary | ICD-10-CM

## 2021-12-20 DIAGNOSIS — K219 Gastro-esophageal reflux disease without esophagitis: Secondary | ICD-10-CM

## 2021-12-20 DIAGNOSIS — I10 Essential (primary) hypertension: Secondary | ICD-10-CM

## 2021-12-20 DIAGNOSIS — Z9989 Dependence on other enabling machines and devices: Secondary | ICD-10-CM

## 2021-12-20 DIAGNOSIS — Z789 Other specified health status: Secondary | ICD-10-CM

## 2021-12-20 DIAGNOSIS — Z6841 Body Mass Index (BMI) 40.0 and over, adult: Secondary | ICD-10-CM

## 2021-12-20 DIAGNOSIS — R7303 Prediabetes: Secondary | ICD-10-CM

## 2021-12-20 DIAGNOSIS — G4733 Obstructive sleep apnea (adult) (pediatric): Secondary | ICD-10-CM

## 2021-12-20 DIAGNOSIS — E782 Mixed hyperlipidemia: Secondary | ICD-10-CM

## 2021-12-20 MED ORDER — ASPIRIN ADULT LOW STRENGTH 81 MG PO TBEC: 81.0000 mg | DELAYED_RELEASE_TABLET | Freq: Every day | ORAL | 3 refills | Status: DC

## 2021-12-20 MED ORDER — HYDROCHLOROTHIAZIDE 25 MG PO TABS: 25.0000 mg | ORAL_TABLET | Freq: Every day | ORAL | 1 refills | Status: DC

## 2021-12-20 MED ORDER — METOPROLOL TARTRATE 25 MG PO TABS: 25.0000 mg | ORAL_TABLET | Freq: Two times a day (BID) | ORAL | 1 refills | Status: DC

## 2021-12-20 MED ORDER — COLESEVELAM HCL 625 MG PO TABS
1250.0000 mg | ORAL_TABLET | Freq: Two times a day (BID) | ORAL | 2 refills | Status: DC
Start: 2021-12-20 — End: 2022-09-29

## 2021-12-20 MED ORDER — BUPROPION HCL ER (XL) 150 MG PO TB24: 150.0000 mg | ORAL_TABLET | Freq: Every day | ORAL | 1 refills | Status: DC

## 2021-12-20 MED ORDER — LOSARTAN POTASSIUM 50 MG PO TABS: 50.0000 mg | ORAL_TABLET | Freq: Every day | ORAL | 1 refills | Status: DC

## 2021-12-20 NOTE — Patient Instructions (Addendum)
?  Next appt in 5.5 months- schedule as your physical so we can get your labs and preventive care caught back up.  ?

## 2021-12-20 NOTE — Progress Notes (Signed)
? ? ? ?This visit occurred during the SARS-CoV-2 public health emergency.  Safety protocols were in place, including screening questions prior to the visit, additional usage of staff PPE, and extensive cleaning of exam room while observing appropriate contact time as indicated for disinfecting solutions.  ? ? ?Christian Escobar , [birth year], 46 y.o., male ?MRN: [id number] ?Patient Care Team  ?  Relationship Specialty Notifications Start End  ?Natalia Leatherwood, DO PCP - General Family Medicine  09/01/16   ?Dyane Dustman Renford Dills., MD Referring Physician Cardiology  12/01/19   ? ? ?Chief Complaint  ?Patient presents with  ? Hypertension  ?  Cmc; pt is not fasting  ? ?  ?Subjective: Christian Escobar is a 46 y.o. male present for [facility] ?Hypertension/Severe -morbidobesity/hyperlipidemia/tachy: ?Pt reports compliance with metoprolol 25 mg twice daily, HCTZ 25 mg daily,  and Cozaar 50 mg daily. Patient denies chest pain, shortness of breath, dizziness or lower extremity edema.  ?Pt takes a daily baby ASA.  Patient is statin intolerant-he has been switched over to [facility] and tolerating.  ? ?Prediabetes: ?He has gained weight since last appt. He is on the road a great deal with his job and had difficulty with choosing healthier food options d/t availability.  ? ?Depression screen [facility] 2/9 12/20/2021 09/19/2020 11/18/2019 09/29/2016 09/01/2016  ?Decreased Interest 0 0 0 0 0  ?Down, Depressed, Hopeless 0 0 0 0 0  ?PHQ - 2 Score 0 0 0 0 0  ? ? ?Allergies  ?Allergen Reactions  ? Lipitor [Atorvastatin] Other (See Comments)  ?  Elevated LFTs, elevated CK with atorvastatin  ? Lisinopril Cough  ? ?Social History  ? ?Social History Narrative  ? Married to [place]. No children.   ? BS. Div. Chief Financial Officer.   ? Drinks caffeine.   ? Wears seatbelt, bicycle helmet. Smoke detector in the home.   ? Exercises 3x a week.   ? Firearms locked in the home.   ? Feels safe in his relationships.   ? ?Past Medical History:  ?Diagnosis Date  ? Allergy   ?  Asthma   ? Atypical chest pain 12/12/2019  ? COVID-19 09/2019  ? Elevated CK 12/02/2019  ? Elevated LFTs 12/02/2019  ? Essential hypertension, benign 09/01/2016  ? Gastroesophageal reflux disease without esophagitis 09/10/2016  ? GERD (gastroesophageal reflux disease)   ? Globus sensation 12/30/2019  ? Hepatic steatosis 12/18/2020  ? History of Helicobacter infection 12/18/2020  ? Hyperlipidemia   ? Hypertension   ? Mixed hyperlipidemia 12/02/2019  ? Morbid obesity with BMI of 40.0-44.9, adult (HCC) 03/29/2021  ? OSA on CPAP 01/19/2017  ? Trial of CPAP therapy on 17 cm H2O with EPR 2, a Medium size Fisher&Paykel Full Face Mask Simplus mask and heated humidification. - Avoid alcohol, sedatives and other CNS depressants that may worsen sleep apnea and disrupt normal sleep architecture. - Sleep hygiene should be reviewed to assess factors that may improve sleep quality. - Weight management and regular exercise should be initiated or c  ? Prediabetes 09/29/2016  ? Retroperitoneal lymphadenopathy 01/07/2021  ? reactive only- saw onc  ? Severe obesity (BMI >= 40) (HCC) 09/01/2016  ? Sleep apnea with use of continuous positive airway pressure (CPAP)   ? Statin intolerance 03/29/2021  ? Tachycardia 09/01/2016  ? ?Past Surgical History:  ?Procedure Laterality Date  ? NO PAST SURGERIES    ? ?Family History  ?Problem Relation Age of Onset  ? Hypertension Father   ? Arthritis Father   ? GER  disease Father   ? Colon cancer Neg Hx   ? Esophageal cancer Neg Hx   ? Pancreatic cancer Neg Hx   ? Rectal cancer Neg Hx   ? Stomach cancer Neg Hx   ? ?Allergies as of 12/20/2021   ? ?   Reactions  ? Lipitor [atorvastatin] Other (See Comments)  ? Elevated LFTs, elevated CK with atorvastatin  ? Lisinopril Cough  ? ?  ? ?  ?Medication List  ?  ? ?  ? Accurate as of December 20, 2021  2:59 PM. If you have any questions, ask your nurse or doctor.  ?  ?  ? ?  ? ?Aspirin Adult Low Strength 81 MG EC tablet ?Generic drug: aspirin ?Take 1 tablet (81 mg total) by  mouth daily. ?  ?colesevelam 625 MG tablet ?Commonly known as: Welchol ?Take 2 tablets (1,250 mg total) by mouth 2 (two) times daily with a meal. ?  ?hydrochlorothiazide 25 MG tablet ?Commonly known as: HYDRODIURIL ?Take 1 tablet (25 mg total) by mouth daily. ?  ?losartan 50 MG tablet ?Commonly known as: COZAAR ?Take 1 tablet (50 mg total) by mouth daily. ?  ?metoprolol tartrate 25 MG tablet ?Commonly known as: LOPRESSOR ?Take 1 tablet (25 mg total) by mouth 2 (two) times daily. ?  ?omeprazole 40 MG capsule ?Commonly known as: PRILOSEC ?Take 1 capsule (40 mg total) by mouth every other day. ?  ? ?  ? ? ?All past medical history, surgical history, allergies, family history, immunizations andmedications were updated in the EMR today and reviewed under the history and medication portions of their EMR.    ? ?ROS: Negative, with the exception of above mentioned in HPI ? ? ?Objective:  ?BP 123/77   Pulse 97   Temp 97.7 ?F (36.5 ?C) (Oral)   Ht 5\' 9"  (1.753 m)   Wt (!) 310 lb (140.6 kg)   SpO2 98%   BMI 45.78 kg/m?  ?Body mass index is 45.78 kg/m?Marland Kitchen ?Physical Exam ?Vitals and nursing note reviewed.  ?Constitutional:   ?   General: He is not in acute distress. ?   Appearance: Normal appearance. He is obese. He is not ill-appearing, toxic-appearing or diaphoretic.  ?HENT:  ?   Head: Normocephalic and atraumatic.  ?Eyes:  ?   General: No scleral icterus.    ?   Right eye: No discharge.     ?   Left eye: No discharge.  ?   Extraocular Movements: Extraocular movements intact.  ?   Pupils: Pupils are equal, round, and reactive to light.  ?Cardiovascular:  ?   Rate and Rhythm: Normal rate and regular rhythm.  ?   Heart sounds: No murmur heard. ?Pulmonary:  ?   Effort: Pulmonary effort is normal. No respiratory distress.  ?   Breath sounds: Normal breath sounds. No wheezing, rhonchi or rales.  ?Musculoskeletal:  ?   Cervical back: Neck supple.  ?   Right lower leg: No edema.  ?   Left lower leg: No edema.  ?Lymphadenopathy:   ?   Cervical: No cervical adenopathy.  ?Skin: ?   General: Skin is warm and dry.  ?   Coloration: Skin is not jaundiced or pale.  ?   Findings: No rash.  ?Neurological:  ?   Mental Status: He is alert and oriented to person, place, and time. Mental status is at baseline.  ?Psychiatric:     ?   Mood and Affect: Mood normal.     ?  Behavior: Behavior normal.     ?   Thought Content: Thought content normal.     ?   Judgment: Judgment normal.  ? ? ?No results found. ?No results found. ?No results found for this or any previous visit (from the past 24 hour(s)). ? ?Assessment/Plan: ?Christian Escobar is a 46 y.o. male present for OV for  ?Essential hypertension, benign/Severe obesity (BMI >= 40) (HCC)/morbid obesity ?Stable.  ?Continue the following.  ?- HCTZ 25 mg QD ?-Losartan 50 mg QD ? - Metoprolol 25 mg BID ?Diet/exercise> discussed in length today. He is on roller coaster with diet. He can be disciplined for about 1-2 months and then falls back to old habits.  ?  - offered referral to therapist> he declined for now.  ?  - offered Wellbutrin to help with cravings> he will think about this .Printed script for him.  ?Labs due next visit.  ? ?OSA ?Compliant with CPAP and follows with pulmonology ? ?GERD ?Follows with gastroenterology now for condition. ?Symptoms are greatly improved and they have recently back down to omeprazole once daily. ? ?Reviewed expectations re: course of current medical issues. ?Discussed self-management of symptoms. ?Outlined signs and symptoms indicating need for more acute intervention. ?Patient verbalized understanding and all questions were answered. ?Patient received an After-Visit Summary. ? ? ? ?No orders of the defined types were placed in this encounter. ? ? ?No orders of the defined types were placed in this encounter. ? ? ?Referral Orders  ?No referral(s) requested today  ? ? ? ?Note is dictated utilizing voice recognition software. Although note has been proof read prior to signing,  occasional typographical errors still can be missed. If any questions arise, please do not hesitate to call for verification.  ? ?electronically signed by: ? ?Felix Pacini, DO  ?[facility] [place] ? ? ?

## 2022-03-13 ENCOUNTER — Ambulatory Visit: Payer: 59 | Admitting: Pulmonary Disease

## 2022-04-08 ENCOUNTER — Ambulatory Visit: Payer: 59 | Admitting: Pulmonary Disease

## 2022-05-07 ENCOUNTER — Ambulatory Visit: Payer: 59 | Admitting: Pulmonary Disease

## 2022-05-21 ENCOUNTER — Ambulatory Visit: Payer: 59 | Admitting: Family Medicine

## 2022-05-21 ENCOUNTER — Encounter: Payer: Self-pay | Admitting: Family Medicine

## 2022-05-21 VITALS — BP 136/85 | HR 98 | Temp 98.4°F | Wt 316.4 lb

## 2022-05-21 DIAGNOSIS — H60332 Swimmer's ear, left ear: Secondary | ICD-10-CM | POA: Diagnosis not present

## 2022-05-21 DIAGNOSIS — H6120 Impacted cerumen, unspecified ear: Secondary | ICD-10-CM | POA: Diagnosis not present

## 2022-05-21 DIAGNOSIS — H9202 Otalgia, left ear: Secondary | ICD-10-CM

## 2022-05-21 MED ORDER — CIPRO HC 0.2-1 % OT SUSP: 3.0000 [drp] | Freq: Two times a day (BID) | OTIC | 0 refills | Status: AC

## 2022-05-21 NOTE — Progress Notes (Addendum)
Christian Escobar , 28-Apr-1976, 46 y.o., male MRN: [id number] Patient Care Team    Relationship Specialty Notifications Start End  Natalia Leatherwood, DO PCP - General Family Medicine  09/01/16   Orvilla Fus., MD Referring Physician Cardiology  12/01/19     Chief Complaint  Patient presents with   Ear Pain    Pt states it is blocked thinks it is from swimming this wknd     Subjective: Pt presents for an OV with complaints of left ear discomfort. Left ear sounds clogged after swimming last 3 days, used debrox with small amount of wax return. Still sounds clogged.  He states he had pain in his ear this morning, but that resolved. He denies fevers, chills or upper respiratory symptoms.     12/20/2021    2:54 PM 09/19/2020    1:27 PM 11/18/2019    8:04 AM 09/29/2016    8:22 AM 09/01/2016    8:53 AM  Depression screen PHQ 2/9  Decreased Interest 0 0 0 0 0  Down, Depressed, Hopeless 0 0 0 0 0  PHQ - 2 Score 0 0 0 0 0    Allergies  Allergen Reactions   Lipitor [Atorvastatin] Other (See Comments)    Elevated LFTs, elevated CK with atorvastatin   Lisinopril Cough   Social History   Social History Narrative   Married to Christian Escobar. No children.    BS. Div. Chief Financial Officer.    Drinks caffeine.    Wears seatbelt, bicycle helmet. Smoke detector in the home.    Exercises 3x a week.    Firearms locked in the home.    Feels safe in his relationships.    Past Medical History:  Diagnosis Date   Allergy    Asthma    Atypical chest pain 12/12/2019   COVID-19 09/2019   Elevated CK 12/02/2019   Elevated LFTs 12/02/2019   Essential hypertension, benign 09/01/2016   Gastroesophageal reflux disease without esophagitis 09/10/2016   GERD (gastroesophageal reflux disease)    Globus sensation 12/30/2019   Hepatic steatosis 12/18/2020   History of Helicobacter infection 12/18/2020   Hyperlipidemia    Hypertension    Mixed hyperlipidemia 12/02/2019   Morbid obesity with BMI of  40.0-44.9, adult (HCC) 03/29/2021   OSA on CPAP 01/19/2017   Trial of CPAP therapy on 17 cm H2O with EPR 2, a Medium size Fisher&Paykel Full Face Mask Simplus mask and heated humidification. - Avoid alcohol, sedatives and other CNS depressants that may worsen sleep apnea and disrupt normal sleep architecture. - Sleep hygiene should be reviewed to assess factors that may improve sleep quality. - Weight management and regular exercise should be initiated or c   Prediabetes 09/29/2016   Retroperitoneal lymphadenopathy 01/07/2021   reactive only- saw onc   Severe obesity (BMI >= 40) (HCC) 09/01/2016   Sleep apnea with use of continuous positive airway pressure (CPAP)    Statin intolerance 03/29/2021   Tachycardia 09/01/2016   Past Surgical History:  Procedure Laterality Date   NO PAST SURGERIES     Family History  Problem Relation Age of Onset   Hypertension Father    Arthritis Father    GER disease Father    Colon cancer Neg Hx    Esophageal cancer Neg Hx    Pancreatic cancer Neg Hx    Rectal cancer Neg Hx    Stomach cancer Neg Hx    Allergies as of 05/21/2022       Reactions  Lipitor [atorvastatin] Other (See Comments)   Elevated LFTs, elevated CK with atorvastatin   Lisinopril Cough        Medication List        Accurate as of May 21, 2022  4:27 PM. If you have any questions, ask your nurse or doctor.          STOP taking these medications    buPROPion 150 MG 24 hr tablet Commonly known as: Wellbutrin XL Stopped by: Felix Pacini, DO       TAKE these medications    Aspirin Adult Low Strength 81 MG tablet Generic drug: aspirin EC Take 1 tablet (81 mg total) by mouth daily.   Cipro HC OTIC suspension Generic drug: ciprofloxacin-hydrocortisone Place 3 drops into the left ear 2 (two) times daily for 7 days. Started by: Felix Pacini, DO   colesevelam 625 MG tablet Commonly known as: Welchol Take 2 tablets (1,250 mg total) by mouth 2 (two) times daily with a  meal.   hydrochlorothiazide 25 MG tablet Commonly known as: HYDRODIURIL Take 1 tablet (25 mg total) by mouth daily.   losartan 50 MG tablet Commonly known as: COZAAR Take 1 tablet (50 mg total) by mouth daily.   metoprolol tartrate 25 MG tablet Commonly known as: LOPRESSOR Take 1 tablet (25 mg total) by mouth 2 (two) times daily.   omeprazole 40 MG capsule Commonly known as: PRILOSEC Take 1 capsule (40 mg total) by mouth every other day.        All past medical history, surgical history, allergies, family history, immunizations andmedications were updated in the EMR today and reviewed under the history and medication portions of their EMR.     ROS Negative, with the exception of above mentioned in HPI   Objective:  BP 136/85   Pulse 98   Temp 98.4 F (36.9 C)   Wt (!) 316 lb 6.4 oz (143.5 kg)   SpO2 95%   BMI 46.72 kg/m  Body mass index is 46.72 kg/m. Physical Exam Vitals and nursing note reviewed.  Constitutional:      General: He is not in acute distress.    Appearance: Normal appearance. He is not ill-appearing, toxic-appearing or diaphoretic.  HENT:     Head: Normocephalic and atraumatic.     Right Ear: Tympanic membrane and ear canal normal. There is no impacted cerumen.     Left Ear: There is impacted cerumen.  Eyes:     General: No scleral icterus.       Right eye: No discharge.        Left eye: No discharge.     Extraocular Movements: Extraocular movements intact.     Pupils: Pupils are equal, round, and reactive to light.  Skin:    Findings: No rash.  Neurological:     Mental Status: He is alert and oriented to person, place, and time. Mental status is at baseline.  Psychiatric:        Mood and Affect: Mood normal.        Behavior: Behavior normal.        Thought Content: Thought content normal.        Judgment: Judgment normal.      No results found. No results found. No results found for this or any previous visit (from the past 24  hour(s)).  Assessment/Plan: Cephas Revard is a 46 y.o. male present for OV for  Ear discomfort, left/Cerumen in auditory canal on examination Procedure: Cerumen removal-irrigation/lavage Patient was verbally consented  to procedure. Water-peroxide solution was applied and gentle ear lavage performed on left ear(s).  There were no complications.  Tympanic membrane was visualized after lavage.  Tympanic membrane(s) intact.  Auditory canal(s) erythema of the canal and TM.  Patient tolerated procedure well.  Patient reported relief of symptoms after removal of cerumen. After irrigation tympanic membrane was able to be visualized clearly.  BM intact with erythema present.  Erythema present in canal as well.  Elected to treat as otitis externa. Prescribed Cipro HC otic twice daily x7 days Encouraged him to use Advil for discomfort  Reviewed expectations re: course of current medical issues. Discussed self-management of symptoms. Outlined signs and symptoms indicating need for more acute intervention. Patient verbalized understanding and all questions were answered. Patient received an After-Visit Summary.    No orders of the defined types were placed in this encounter.  Meds ordered this encounter  Medications   ciprofloxacin-hydrocortisone (CIPRO HC) OTIC suspension    Sig: Place 3 drops into the left ear 2 (two) times daily for 7 days.    Dispense:  10 mL    Refill:  0   Referral Orders  No referral(s) requested today     Note is dictated utilizing voice recognition software. Although note has been proof read prior to signing, occasional typographical errors still can be missed. If any questions arise, please do not hesitate to call for verification.   electronically signed by:  Felix Pacini, DO  [facility] [place]

## 2022-05-21 NOTE — Patient Instructions (Signed)

## 2022-07-17 ENCOUNTER — Ambulatory Visit: Payer: 59 | Admitting: Pulmonary Disease

## 2022-07-17 ENCOUNTER — Encounter: Payer: Self-pay | Admitting: Pulmonary Disease

## 2022-07-17 VITALS — BP 118/72 | HR 70 | Temp 98.2°F | Ht 69.0 in | Wt 307.0 lb

## 2022-07-17 DIAGNOSIS — Z9989 Dependence on other enabling machines and devices: Secondary | ICD-10-CM

## 2022-07-17 DIAGNOSIS — G4733 Obstructive sleep apnea (adult) (pediatric): Secondary | ICD-10-CM

## 2022-07-17 NOTE — Patient Instructions (Signed)
I will see you a year from now  Continue using CPAP nightly  Try moleskin to see if this helps with him skin sensitivity  Another option would be a nasal pillow with a mild tape  Call us with significant concerns

## 2022-07-17 NOTE — Progress Notes (Signed)
Subjective:    Patient ID: Christian Escobar, male    DOB: 08/14/1976, 46 y.o.   MRN: [id number]  Recently diagnosed with severe obstructive sleep apnea has been using CPAP regularly  Benefiting from CPAP Waking up feeling like he is at a good nights rest  Very compliant with CPAP use  Has been having some difficulty with the mask leaking and with nasal bridge rash  His health has been okay Still gets very restorative sleep No longer having significant headaches  Has been using CPAP pressure of 17 Gets about 8 hours of sleep on a CPAP  Continues to lose weight Significant weight gain from about age 17 Weight about 180 when he was [age]  Has history of high blood pressure, asthma, hypercholesterolemia  Before his diagnosis- Admits to dryness of his mouth in the mornings Admits to gasping respirations at night His memory is good Denies sleepy driving     Past Medical History:  Diagnosis Date   Allergy    Asthma    Atypical chest pain 12/12/2019   COVID-19 09/2019   Elevated CK 12/02/2019   Elevated LFTs 12/02/2019   Essential hypertension, benign 09/01/2016   Gastroesophageal reflux disease without esophagitis 09/10/2016   GERD (gastroesophageal reflux disease)    Globus sensation 12/30/2019   Hepatic steatosis 12/18/2020   History of Helicobacter infection 12/18/2020   Hyperlipidemia    Hypertension    Mixed hyperlipidemia 12/02/2019   Morbid obesity with BMI of 40.0-44.9, adult (HCC) 03/29/2021   OSA on CPAP 01/19/2017   Trial of CPAP therapy on 17 cm H2O with EPR 2, a Medium size Fisher&Paykel Full Face Mask Simplus mask and heated humidification. - Avoid alcohol, sedatives and other CNS depressants that may worsen sleep apnea and disrupt normal sleep architecture. - Sleep hygiene should be reviewed to assess factors that may improve sleep quality. - Weight management and regular exercise should be initiated or c   Prediabetes 09/29/2016   Retroperitoneal  lymphadenopathy 01/07/2021   reactive only- saw onc   Severe obesity (BMI >= 40) (HCC) 09/01/2016   Sleep apnea with use of continuous positive airway pressure (CPAP)    Statin intolerance 03/29/2021   Tachycardia 09/01/2016   Social History   Socioeconomic History   Marital status: Married    Spouse name: Natalia Leatherwood   Number of children: 0   Years of education: 16   Highest education level: Bachelor's degree (e.g., BA, AB, BS)  Occupational History   Occupation: Administrator, sports  Tobacco Use   Smoking status: Never   Smokeless tobacco: Never  Vaping Use   Vaping Use: Never used  Substance and Sexual Activity   Alcohol use: No   Drug use: No   Sexual activity: Yes    Partners: Female    Comment: married  Other Topics Concern   Not on file  Social History Narrative   Married to [place]. No children.    BS. Div. Chief Financial Officer.    Drinks caffeine.    Wears seatbelt, bicycle helmet. Smoke detector in the home.    Exercises 3x a week.    Firearms locked in the home.    Feels safe in his relationships.    Social Determinants of Health   Financial Resource Strain: Low Risk  (12/19/2021)   Overall Financial Resource Strain (CARDIA)    Difficulty of Paying Living Expenses: Not hard at all  Food Insecurity: No Food Insecurity (12/19/2021)   Hunger Vital Sign    Worried About  Running Out of Food in the Last Year: Never true    [place] in the Last Year: Never true  Transportation Needs: No Transportation Needs (12/19/2021)   PRAPARE - Hydrologist (Medical): No    Lack of Transportation (Non-Medical): No  Physical Activity: Sufficiently Active (12/19/2021)   Exercise Vital Sign    Days of Exercise per Week: 5 days    Minutes of Exercise per Session: 120 min  Stress: No Stress Concern Present (12/19/2021)   [place]    Feeling of Stress : Not at all  Social  Connections: Unknown (12/19/2021)   Social Connection and Isolation Panel [NHANES]    Frequency of Communication with Friends and Family: Patient refused    Frequency of Social Gatherings with Friends and Family: Patient refused    Attends Religious Services: Never    Marine scientist or Organizations: No    Attends Music therapist: Not on file    Marital Status: Married  Human resources officer Violence: Not on file   Family History  Problem Relation Age of Onset   Hypertension Father    Arthritis Father    GER disease Father    Colon cancer Neg Hx    Esophageal cancer Neg Hx    Pancreatic cancer Neg Hx    Rectal cancer Neg Hx    Stomach cancer Neg Hx    Review of Systems  Constitutional:  Positive for unexpected weight change.  Respiratory:  Positive for apnea.   Cardiovascular:  Negative for chest pain.  Psychiatric/Behavioral:  Positive for sleep disturbance.   All other systems reviewed and are negative.     Objective:   Physical Exam Constitutional:      Appearance: He is obese.  HENT:     Head: Normocephalic.     Mouth/Throat:     Comments: Crowded, Mallampati 2 Neck:     Comments: Neck size 18 Cardiovascular:     Pulses: Normal pulses.     Heart sounds: Normal heart sounds. No murmur heard. Pulmonary:     Effort: Pulmonary effort is normal. No respiratory distress.     Breath sounds: Normal breath sounds. No stridor. No wheezing or rhonchi.  Musculoskeletal:     Cervical back: No rigidity.  Neurological:     Mental Status: He is alert.  Psychiatric:        Mood and Affect: Mood normal.    Vitals:   07/17/22 1612  BP: 118/72  Pulse: 70  Temp: 98.2 F (36.8 C)  SpO2: 97%      12/07/2019    9:00 AM  Results of the Epworth flowsheet  Sitting and reading 2  Watching TV 2  Sitting, inactive in a public place (e.g. a theatre or a meeting) 0  As a passenger in a car for an hour without a break 0  Lying down to rest in the afternoon when  circumstances permit 2  Sitting and talking to someone 0  Sitting quietly after a lunch without alcohol 1  In a car, while stopped for a few minutes in traffic 0  Total score 7      CPAP compliance reviewed showing 100% compliance, CPAP of 14, average use of 8 hours 25 minutes Residual AHI of 0.8  Assessment & Plan:   Severe obstructive sleep apnea -Currently on CPAP of 14 -Tolerating it well -Benefiting from CPAP use  Morbid obesity  Pathophysiology of sleep disordered breathing discussed with the patient   Plan: Continue CPAP  For the nasal skin rash -Attempt moleskin use  May also consider nasal pillows with a mouth tape  Follow-up in a year

## 2022-08-10 ENCOUNTER — Other Ambulatory Visit: Payer: Self-pay | Admitting: Family Medicine

## 2022-09-05 ENCOUNTER — Other Ambulatory Visit: Payer: Self-pay

## 2022-09-05 MED ORDER — LOSARTAN POTASSIUM 50 MG PO TABS: 50.0000 mg | ORAL_TABLET | Freq: Every day | ORAL | 0 refills | Status: DC

## 2022-09-08 ENCOUNTER — Other Ambulatory Visit: Payer: Self-pay | Admitting: Family Medicine

## 2022-09-27 ENCOUNTER — Other Ambulatory Visit: Payer: Self-pay | Admitting: Family Medicine

## 2022-09-27 DIAGNOSIS — R000 Tachycardia, unspecified: Secondary | ICD-10-CM

## 2022-09-27 DIAGNOSIS — I10 Essential (primary) hypertension: Secondary | ICD-10-CM

## 2022-09-28 ENCOUNTER — Other Ambulatory Visit: Payer: Self-pay | Admitting: Gastroenterology

## 2022-09-28 ENCOUNTER — Other Ambulatory Visit: Payer: Self-pay | Admitting: Family Medicine

## 2022-10-01 ENCOUNTER — Other Ambulatory Visit: Payer: Self-pay | Admitting: Family Medicine

## 2022-10-22 ENCOUNTER — Other Ambulatory Visit: Payer: Self-pay | Admitting: Family Medicine

## 2022-10-22 DIAGNOSIS — I10 Essential (primary) hypertension: Secondary | ICD-10-CM

## 2022-10-22 DIAGNOSIS — R000 Tachycardia, unspecified: Secondary | ICD-10-CM

## 2022-10-23 ENCOUNTER — Other Ambulatory Visit: Payer: Self-pay | Admitting: Family Medicine

## 2022-10-23 MED ORDER — HYDROCHLOROTHIAZIDE 25 MG PO TABS: 25.0000 mg | ORAL_TABLET | Freq: Every day | ORAL | 1 refills | Status: DC

## 2022-10-26 ENCOUNTER — Other Ambulatory Visit: Payer: Self-pay | Admitting: Family Medicine

## 2022-10-26 DIAGNOSIS — I10 Essential (primary) hypertension: Secondary | ICD-10-CM

## 2022-10-26 DIAGNOSIS — R000 Tachycardia, unspecified: Secondary | ICD-10-CM

## 2022-11-19 ENCOUNTER — Other Ambulatory Visit: Payer: Self-pay | Admitting: Family Medicine

## 2022-11-20 ENCOUNTER — Other Ambulatory Visit: Payer: Self-pay | Admitting: Family Medicine

## 2022-11-20 DIAGNOSIS — I10 Essential (primary) hypertension: Secondary | ICD-10-CM

## 2022-11-20 DIAGNOSIS — R000 Tachycardia, unspecified: Secondary | ICD-10-CM

## 2022-11-24 ENCOUNTER — Other Ambulatory Visit: Payer: Self-pay | Admitting: Family Medicine

## 2022-11-24 DIAGNOSIS — R000 Tachycardia, unspecified: Secondary | ICD-10-CM

## 2022-11-24 DIAGNOSIS — I10 Essential (primary) hypertension: Secondary | ICD-10-CM

## 2022-12-01 ENCOUNTER — Other Ambulatory Visit: Payer: Self-pay | Admitting: Family Medicine

## 2022-12-01 MED ORDER — LOSARTAN POTASSIUM 50 MG PO TABS: 50.0000 mg | ORAL_TABLET | Freq: Every day | ORAL | 0 refills | Status: DC

## 2022-12-15 ENCOUNTER — Other Ambulatory Visit: Payer: Self-pay | Admitting: Family Medicine

## 2022-12-15 DIAGNOSIS — R000 Tachycardia, unspecified: Secondary | ICD-10-CM

## 2022-12-15 DIAGNOSIS — I10 Essential (primary) hypertension: Secondary | ICD-10-CM

## 2022-12-15 MED ORDER — METOPROLOL TARTRATE 25 MG PO TABS: 25.0000 mg | ORAL_TABLET | Freq: Two times a day (BID) | ORAL | 0 refills | Status: DC

## 2022-12-18 ENCOUNTER — Other Ambulatory Visit: Payer: Self-pay | Admitting: Family Medicine

## 2023-01-08 ENCOUNTER — Other Ambulatory Visit: Payer: Self-pay | Admitting: Family Medicine

## 2023-01-08 DIAGNOSIS — R000 Tachycardia, unspecified: Secondary | ICD-10-CM

## 2023-01-08 DIAGNOSIS — I10 Essential (primary) hypertension: Secondary | ICD-10-CM

## 2023-01-11 ENCOUNTER — Other Ambulatory Visit: Payer: Self-pay | Admitting: Family Medicine

## 2023-01-12 ENCOUNTER — Other Ambulatory Visit: Payer: Self-pay | Admitting: Family Medicine

## 2023-01-12 DIAGNOSIS — I10 Essential (primary) hypertension: Secondary | ICD-10-CM

## 2023-01-12 DIAGNOSIS — R000 Tachycardia, unspecified: Secondary | ICD-10-CM

## 2023-01-13 ENCOUNTER — Encounter: Payer: Self-pay | Admitting: Family Medicine

## 2023-01-13 ENCOUNTER — Ambulatory Visit (INDEPENDENT_AMBULATORY_CARE_PROVIDER_SITE_OTHER): Payer: 59 | Admitting: Family Medicine

## 2023-01-13 VITALS — BP 133/81 | HR 61 | Temp 98.1°F | Ht 69.29 in | Wt 320.6 lb

## 2023-01-13 DIAGNOSIS — Z789 Other specified health status: Secondary | ICD-10-CM

## 2023-01-13 DIAGNOSIS — Z1211 Encounter for screening for malignant neoplasm of colon: Secondary | ICD-10-CM

## 2023-01-13 DIAGNOSIS — Z0000 Encounter for general adult medical examination without abnormal findings: Secondary | ICD-10-CM

## 2023-01-13 DIAGNOSIS — R000 Tachycardia, unspecified: Secondary | ICD-10-CM

## 2023-01-13 DIAGNOSIS — I10 Essential (primary) hypertension: Secondary | ICD-10-CM

## 2023-01-13 DIAGNOSIS — E782 Mixed hyperlipidemia: Secondary | ICD-10-CM | POA: Diagnosis not present

## 2023-01-13 DIAGNOSIS — R7303 Prediabetes: Secondary | ICD-10-CM | POA: Diagnosis not present

## 2023-01-13 DIAGNOSIS — G4733 Obstructive sleep apnea (adult) (pediatric): Secondary | ICD-10-CM

## 2023-01-13 DIAGNOSIS — Z6841 Body Mass Index (BMI) 40.0 and over, adult: Secondary | ICD-10-CM

## 2023-01-13 LAB — CBC WITH DIFFERENTIAL/PLATELET
Basophils Absolute: 0.1 10*3/uL (ref 0.0–0.1)
Basophils Relative: 0.7 % (ref 0.0–3.0)
Eosinophils Absolute: 0.3 10*3/uL (ref 0.0–0.7)
Eosinophils Relative: 3.3 % (ref 0.0–5.0)
HCT: 44.5 % (ref 39.0–52.0)
Hemoglobin: 14.9 g/dL (ref 13.0–17.0)
Lymphocytes Relative: 28 % (ref 12.0–46.0)
Lymphs Abs: 2.4 10*3/uL (ref 0.7–4.0)
MCHC: 33.6 g/dL (ref 30.0–36.0)
MCV: 90.3 fl (ref 78.0–100.0)
Monocytes Absolute: 0.8 10*3/uL (ref 0.1–1.0)
Monocytes Relative: 8.8 % (ref 3.0–12.0)
Neutro Abs: 5.1 10*3/uL (ref 1.4–7.7)
Neutrophils Relative %: 59.2 % (ref 43.0–77.0)
Platelets: 312 10*3/uL (ref 150.0–400.0)
RBC: 4.93 Mil/uL (ref 4.22–5.81)
RDW: 14 % (ref 11.5–15.5)
WBC: 8.7 10*3/uL (ref 4.0–10.5)

## 2023-01-13 LAB — COMPREHENSIVE METABOLIC PANEL
ALT: 48 U/L (ref 0–53)
AST: 25 U/L (ref 0–37)
Albumin: 4.5 g/dL (ref 3.5–5.2)
Alkaline Phosphatase: 58 U/L (ref 39–117)
BUN: 19 mg/dL (ref 6–23)
CO2: 31 mEq/L (ref 19–32)
Calcium: 9.9 mg/dL (ref 8.4–10.5)
Chloride: 101 mEq/L (ref 96–112)
Creatinine, Ser: 1.17 mg/dL (ref 0.40–1.50)
GFR: 75.54 mL/min (ref >60.00)
Glucose, Bld: 98 mg/dL (ref 70–99)
Potassium: 3.6 mEq/L (ref 3.5–5.1)
Sodium: 140 mEq/L (ref 135–145)
Total Bilirubin: 0.5 mg/dL (ref 0.2–1.2)
Total Protein: 7.1 g/dL (ref 6.0–8.3)

## 2023-01-13 LAB — LIPID PANEL
Cholesterol: 182 mg/dL (ref 0–200)
HDL: 32.7 mg/dL — ABNORMAL LOW (ref >39.00)
NonHDL: 149.38
Total CHOL/HDL Ratio: 6
Triglycerides: 266 mg/dL — ABNORMAL HIGH (ref 0.0–149.0)
VLDL: 53.2 mg/dL — ABNORMAL HIGH (ref 0.0–40.0)

## 2023-01-13 LAB — LDL CHOLESTEROL, DIRECT: Direct LDL: 105 mg/dL

## 2023-01-13 LAB — TSH: TSH: 2.43 u[IU]/mL (ref 0.35–5.50)

## 2023-01-13 LAB — HEMOGLOBIN A1C: Hgb A1c MFr Bld: 5.5 % (ref 4.6–6.5)

## 2023-01-13 MED ORDER — METOPROLOL TARTRATE 25 MG PO TABS: 25.0000 mg | ORAL_TABLET | Freq: Two times a day (BID) | ORAL | 1 refills | Status: DC

## 2023-01-13 MED ORDER — COLESEVELAM HCL 625 MG PO TABS: ORAL_TABLET | ORAL | 11 refills | Status: DC

## 2023-01-13 MED ORDER — BUPROPION HCL ER (XL) 150 MG PO TB24: 150.0000 mg | ORAL_TABLET | Freq: Every day | ORAL | 1 refills | Status: DC

## 2023-01-13 MED ORDER — HYDROCHLOROTHIAZIDE 25 MG PO TABS: 25.0000 mg | ORAL_TABLET | Freq: Every day | ORAL | 1 refills | Status: DC

## 2023-01-13 MED ORDER — ASPIRIN ADULT LOW STRENGTH 81 MG PO TBEC: 81.0000 mg | DELAYED_RELEASE_TABLET | Freq: Every day | ORAL | 3 refills | Status: DC

## 2023-01-13 MED ORDER — LOSARTAN POTASSIUM 100 MG PO TABS: 100.0000 mg | ORAL_TABLET | Freq: Every day | ORAL | 1 refills | Status: DC

## 2023-01-13 NOTE — Progress Notes (Signed)
Christian Escobar , Aug 24, 1976, 47 y.o., male MRN: [id number] Patient Care Team    Relationship Specialty Notifications Start End  Ma Hillock, DO PCP - General Family Medicine  09/01/16   Rhetta Mura., MD Referring Physician Cardiology  12/01/19     Chief Complaint  Patient presents with   Annual Exam    [facility]; Pt is fasting; okay for cologuard     Subjective: Christian Escobar is a 47 y.o. male present for cpe and Chronic Conditions/illness Management  Health maintenance:  Colon cancer screen: No family history.  Routine screening due cologuard ordered today Immunizations: Tdap UTD 2017, influenza declined Infectious disease screening: HIV and hepatitis C screenings completed Prostate: Family history.  Routine screening at 50   Hypertension/Severe -morbidobesity/hyperlipidemia/tachy: Pt reports compliance with metoprolol 25 mg twice daily, HCTZ 25 mg daily,  and Cozaar 50 mg daily.  Patient denies chest pain, shortness of breath, dizziness or lower extremity edema.   Pt takes a daily baby ASA.  Patient is statin intolerant-he has been switched over to [facility] and tolerating.   Prediabetes: He is on the road a great deal with his job and had difficulty with choosing healthier food options d/t availability.      01/13/2023    8:52 AM 12/20/2021    2:54 PM 09/19/2020    1:27 PM 11/18/2019    8:04 AM 09/29/2016    8:22 AM  Depression screen PHQ 2/9  Decreased Interest 0 0 0 0 0  Down, Depressed, Hopeless 0 0 0 0 0  PHQ - 2 Score 0 0 0 0 0    Allergies  Allergen Reactions   Lipitor [Atorvastatin] Other (See Comments)    Elevated LFTs, elevated CK with atorvastatin   Lisinopril Cough   Social History   Social History Narrative   Married to [place]. No children.    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    Drinks caffeine.    Wears seatbelt, bicycle helmet. Smoke detector in the home.    Exercises 3x a week.    Firearms locked in the home.    Feels safe in his  relationships.    Past Medical History:  Diagnosis Date   Allergy    Asthma    Atypical chest pain 12/12/2019   COVID-19 09/2019   Elevated CK 12/02/2019   Elevated LFTs 12/02/2019   Essential hypertension, benign 09/01/2016   Gastroesophageal reflux disease without esophagitis 09/10/2016   GERD (gastroesophageal reflux disease)    Globus sensation 12/30/2019   Hepatic steatosis [id number]   History of Helicobacter infection 12/18/2020   Hyperlipidemia    Hypertension    Mixed hyperlipidemia 12/02/2019   Morbid obesity with BMI of 40.0-44.9, adult ([place]) 03/29/2021   OSA on CPAP 01/19/2017   Trial of CPAP therapy on 17 cm H2O with EPR 2, a Medium size Fisher[employer]Paykel Full Face Mask Simplus mask and heated humidification. - Avoid alcohol, sedatives and other CNS depressants that may worsen sleep apnea and disrupt normal sleep architecture. - Sleep hygiene should be reviewed to assess factors that may improve sleep quality. - Weight management and regular exercise should be initiated or c   Prediabetes 09/29/2016   Retroperitoneal lymphadenopathy 01/07/2021   reactive only- saw onc   Severe obesity (BMI >= 40) (HCC) 09/01/2016   Sleep apnea with use of continuous positive airway pressure (CPAP)    Statin intolerance 03/29/2021   Tachycardia 09/01/2016   Past Surgical History:  Procedure Laterality Date  NO PAST SURGERIES     Family History  Problem Relation Age of Onset   Hypertension Father    Arthritis Father    GER disease Father    Colon cancer Neg Hx    Esophageal cancer Neg Hx    Pancreatic cancer Neg Hx    Rectal cancer Neg Hx    Stomach cancer Neg Hx    Allergies as of 01/13/2023       Reactions   Lipitor [atorvastatin] Other (See Comments)   Elevated LFTs, elevated CK with atorvastatin   Lisinopril Cough        Medication List        Accurate as of January 13, 2023  9:15 AM. If you have any questions, ask your nurse or doctor.          Aspirin Adult Low  Strength 81 MG tablet Generic drug: aspirin EC Take 1 tablet (81 mg total) by mouth daily.   buPROPion 150 MG 24 hr tablet Commonly known as: Wellbutrin XL Take 1 tablet (150 mg total) by mouth daily. Started by: Howard Pouch, DO   colesevelam 625 MG tablet Commonly known as: WELCHOL TAKE 2 TABLETS BY MOUTH 2 TIMES DAILY WITH A MEAL. What changed: See the new instructions. Changed by: Howard Pouch, DO   hydrochlorothiazide 25 MG tablet Commonly known as: HYDRODIURIL Take 1 tablet (25 mg total) by mouth daily.   losartan 100 MG tablet Commonly known as: COZAAR Take 1 tablet (100 mg total) by mouth daily. What changed:  medication strength how much to take Changed by: Howard Pouch, DO   metoprolol tartrate 25 MG tablet Commonly known as: LOPRESSOR Take 1 tablet (25 mg total) by mouth 2 (two) times daily. What changed: additional instructions Changed by: Howard Pouch, DO   omeprazole 40 MG capsule Commonly known as: PRILOSEC TAKE 1 CAPSULE (40 MG TOTAL) BY MOUTH EVERY OTHER DAY        All past medical history, surgical history, allergies, family history, immunizations andmedications were updated in the EMR today and reviewed under the history and medication portions of their EMR.     ROS: Negative, with the exception of above mentioned in HPI   Objective:  BP 133/81   Pulse 61   Temp 98.1 F (36.7 C)   Ht 5' 9.29" (1.76 m)   Wt (!) 320 lb 9.6 oz (145.4 kg)   SpO2 96%   BMI 46.95 kg/m  Body mass index is 46.95 kg/m. Physical Exam Constitutional:      General: He is not in acute distress.    Appearance: Normal appearance. He is obese. He is not ill-appearing, toxic-appearing or diaphoretic.  HENT:     Head: Normocephalic and atraumatic.     Right Ear: Tympanic membrane, ear canal and external ear normal. There is no impacted cerumen.     Left Ear: Tympanic membrane, ear canal and external ear normal. There is no impacted cerumen.     Nose: Nose normal. No  congestion or rhinorrhea.     Mouth/Throat:     Mouth: Mucous membranes are moist.     Pharynx: Oropharynx is clear. No oropharyngeal exudate or posterior oropharyngeal erythema.  Eyes:     General: No scleral icterus.       Right eye: No discharge.        Left eye: No discharge.     Extraocular Movements: Extraocular movements intact.     Pupils: Pupils are equal, round, and reactive to light.  Cardiovascular:     Rate and Rhythm: Normal rate and regular rhythm.     Pulses: Normal pulses.     Heart sounds: Normal heart sounds. No murmur heard.    No friction rub. No gallop.  Pulmonary:     Effort: Pulmonary effort is normal. No respiratory distress.     Breath sounds: Normal breath sounds. No stridor. No wheezing, rhonchi or rales.  Chest:     Chest wall: No tenderness.  Abdominal:     General: Abdomen is flat. Bowel sounds are normal. There is no distension.     Palpations: Abdomen is soft. There is no mass.     Tenderness: There is no abdominal tenderness. There is no right CVA tenderness, left CVA tenderness, guarding or rebound.     Hernia: No hernia is present.  Musculoskeletal:        General: No swelling or tenderness. Normal range of motion.     Cervical back: Normal range of motion and neck supple.     Right lower leg: No edema.     Left lower leg: No edema.  Lymphadenopathy:     Cervical: No cervical adenopathy.  Skin:    General: Skin is warm and dry.     Coloration: Skin is not jaundiced.     Findings: No bruising, lesion or rash.  Neurological:     General: No focal deficit present.     Mental Status: He is alert and oriented to person, place, and time. Mental status is at baseline.     Cranial Nerves: No cranial nerve deficit.     Sensory: No sensory deficit.     Motor: No weakness.     Coordination: Coordination normal.     Gait: Gait normal.     Deep Tendon Reflexes: Reflexes normal.  Psychiatric:        Mood and Affect: Mood normal.        Behavior:  Behavior normal.        Thought Content: Thought content normal.        Judgment: Judgment normal.     No results found. No results found. No results found for this or any previous visit (from the past 24 hour(s)).  Assessment/Plan: Nora Crick is a 47 y.o. male present for OV for CPE and routine chronic condition management Essential hypertension, benign/Severe obesity (BMI >= 40) (HCC)/morbid obesity Stable Continue HCTZ 25 mg QD Continue losartan 50 mg QD Continue metoprolol 25 mg BID Continue WelChol Diet/exercise> discussed in length today. He is on roller coaster with diet. He can be disciplined for about 1-2 months and then falls back to old habits.    - offered referral to therapist> he declined for now.    -start Wellbutrin to help with cravings- discussed benefits/risks CBC, CMP, TSH and lipids collected today Prediabetes A1c collected today OSA Compliant with CPAP and follows with pulmonology  Colon cancer screening - Cologuard  Morbid obesity with BMI of 40.0-44.9, adult (HCC) - buPROPion (WELLBUTRIN XL) 150 MG 24 hr tablet; Take 1 tablet (150 mg total) by mouth daily.  Dispense: 90 tablet; Refill: 1  OSA on CPAP Compliant   Routine general medical examination at a health care facility Patient was encouraged to exercise greater than 150 minutes a week. Patient was encouraged to choose a diet filled with fresh fruits and vegetables, and lean meats. AVS provided to patient today for education/recommendation on gender specific health and safety maintenance. Colon cancer screen: No family history.  Routine  screening due cologuard ordered today Immunizations: Tdap UTD 2017, influenza declined Infectious disease screening: HIV and hepatitis C screenings completed Prostate: Family history.  Routine screening at 50  Reviewed expectations re: course of current medical issues. Discussed self-management of symptoms. Outlined signs and symptoms indicating need for more  acute intervention. Patient verbalized understanding and all questions were answered. Patient received an After-Visit Summary.    Orders Placed This Encounter  Procedures   CBC with Differential/Platelet   Comprehensive metabolic panel   Hemoglobin A1c   Lipid panel   TSH   Cologuard    Meds ordered this encounter  Medications   ASPIRIN ADULT LOW STRENGTH 81 MG tablet    Sig: Take 1 tablet (81 mg total) by mouth daily.    Dispense:  90 tablet    Refill:  3   colesevelam (WELCHOL) 625 MG tablet    Sig: TAKE 2 TABLETS BY MOUTH 2 TIMES DAILY WITH A MEAL.    Dispense:  120 tablet    Refill:  11   hydrochlorothiazide (HYDRODIURIL) 25 MG tablet    Sig: Take 1 tablet (25 mg total) by mouth daily.    Dispense:  90 tablet    Refill:  1   losartan (COZAAR) 100 MG tablet    Sig: Take 1 tablet (100 mg total) by mouth daily.    Dispense:  90 tablet    Refill:  1   metoprolol tartrate (LOPRESSOR) 25 MG tablet    Sig: Take 1 tablet (25 mg total) by mouth 2 (two) times daily.    Dispense:  180 tablet    Refill:  1   buPROPion (WELLBUTRIN XL) 150 MG 24 hr tablet    Sig: Take 1 tablet (150 mg total) by mouth daily.    Dispense:  90 tablet    Refill:  1    Referral Orders  No referral(s) requested today     Note is dictated utilizing voice recognition software. Although note has been proof read prior to signing, occasional typographical errors still can be missed. If any questions arise, please do not hesitate to call for verification.   electronically signed by:  Howard Pouch, DO  [place]

## 2023-01-13 NOTE — Patient Instructions (Signed)
Return in about 24 weeks (around 06/30/2023) for Routine chronic condition follow-up.        Great to see you today.  I have refilled the medication(s) we provide.   If labs were collected, we will inform you of lab results once received either by echart message or telephone call.   - echart message- for normal results that have been seen by the patient already.   - telephone call: abnormal results or if patient has not viewed results in their echart.

## 2023-01-20 ENCOUNTER — Encounter: Payer: Self-pay | Admitting: Pulmonary Disease

## 2023-01-22 NOTE — Telephone Encounter (Signed)
A DME company may be able to help with this  I am not vast on what mask is specifically compatible with different machines  DME referral may be placed for a cloth mask-this may be tried in place of current mask  DME company may be able to provide more information

## 2023-02-18 LAB — COLOGUARD: COLOGUARD: NEGATIVE

## 2023-05-24 ENCOUNTER — Other Ambulatory Visit: Payer: Self-pay | Admitting: Family Medicine

## 2023-07-06 ENCOUNTER — Ambulatory Visit: Payer: 59 | Admitting: Pulmonary Disease

## 2023-07-18 ENCOUNTER — Other Ambulatory Visit: Payer: Self-pay | Admitting: Family Medicine

## 2023-09-04 ENCOUNTER — Ambulatory Visit: Payer: 59 | Admitting: Family Medicine

## 2023-10-05 ENCOUNTER — Encounter: Payer: Self-pay | Admitting: Family Medicine

## 2023-10-05 MED ORDER — LOSARTAN POTASSIUM 100 MG PO TABS: 100.0000 mg | ORAL_TABLET | Freq: Every day | ORAL | 0 refills | Status: DC

## 2023-10-05 MED ORDER — METOPROLOL TARTRATE 25 MG PO TABS: 25.0000 mg | ORAL_TABLET | Freq: Two times a day (BID) | ORAL | 0 refills | Status: DC

## 2023-10-05 MED ORDER — HYDROCHLOROTHIAZIDE 25 MG PO TABS: 25.0000 mg | ORAL_TABLET | Freq: Every day | ORAL | 0 refills | Status: DC

## 2023-10-12 ENCOUNTER — Ambulatory Visit: Payer: 59 | Admitting: Family Medicine

## 2023-10-23 ENCOUNTER — Ambulatory Visit: Payer: 59 | Admitting: Family Medicine

## 2023-10-30 ENCOUNTER — Other Ambulatory Visit: Payer: Self-pay | Admitting: Family Medicine

## 2023-11-03 ENCOUNTER — Other Ambulatory Visit: Payer: Self-pay | Admitting: Family Medicine

## 2023-11-10 ENCOUNTER — Other Ambulatory Visit: Payer: Self-pay | Admitting: Family Medicine

## 2023-11-16 ENCOUNTER — Ambulatory Visit: Payer: 59 | Admitting: Pulmonary Disease

## 2023-11-16 ENCOUNTER — Ambulatory Visit: Payer: 59 | Admitting: Family Medicine

## 2023-11-25 ENCOUNTER — Other Ambulatory Visit: Payer: Self-pay | Admitting: Family Medicine

## 2023-12-15 ENCOUNTER — Other Ambulatory Visit: Payer: Self-pay | Admitting: Family Medicine

## 2023-12-15 ENCOUNTER — Encounter: Payer: Self-pay | Admitting: Family Medicine

## 2023-12-15 ENCOUNTER — Ambulatory Visit: Payer: 59 | Admitting: Family Medicine

## 2023-12-15 VITALS — BP 134/86 | HR 78 | Temp 98.1°F | Wt 308.0 lb

## 2023-12-15 DIAGNOSIS — R7303 Prediabetes: Secondary | ICD-10-CM

## 2023-12-15 DIAGNOSIS — K219 Gastro-esophageal reflux disease without esophagitis: Secondary | ICD-10-CM

## 2023-12-15 DIAGNOSIS — I10 Essential (primary) hypertension: Secondary | ICD-10-CM | POA: Diagnosis not present

## 2023-12-15 DIAGNOSIS — Z23 Encounter for immunization: Secondary | ICD-10-CM

## 2023-12-15 DIAGNOSIS — G4733 Obstructive sleep apnea (adult) (pediatric): Secondary | ICD-10-CM

## 2023-12-15 DIAGNOSIS — Z6841 Body Mass Index (BMI) 40.0 and over, adult: Secondary | ICD-10-CM

## 2023-12-15 DIAGNOSIS — E782 Mixed hyperlipidemia: Secondary | ICD-10-CM

## 2023-12-15 MED ORDER — METOPROLOL TARTRATE 37.5 MG PO TABS: 37.5000 mg | ORAL_TABLET | Freq: Two times a day (BID) | ORAL | 1 refills | Status: DC

## 2023-12-15 MED ORDER — HYDROCHLOROTHIAZIDE 25 MG PO TABS: 25.0000 mg | ORAL_TABLET | Freq: Every day | ORAL | 1 refills | Status: DC

## 2023-12-15 MED ORDER — LOSARTAN POTASSIUM 100 MG PO TABS: 100.0000 mg | ORAL_TABLET | Freq: Every day | ORAL | 1 refills | Status: DC

## 2023-12-15 NOTE — Progress Notes (Signed)
 Christian Escobar , 09/18/1976, 48 y.o., male MRN: [id number] Patient Care Team    Relationship Specialty Notifications Start End  Natalia Leatherwood, DO PCP - General Family Medicine  09/01/16   Orvilla Fus., MD Referring Physician Cardiology  12/01/19     Chief Complaint  Patient presents with   Hypertension     Subjective: [name] is a 48 y.o. male present for Chronic Conditions/illness Management Hypertension/Severe -morbidobesity/hyperlipidemia/tachy: Pt reports compliance with metoprolol 25 mg twice daily, HCTZ 25 mg daily,  and Cozaar 50 mg daily.  Patient denies chest pain, shortness of breath, dizziness or lower extremity edema.  Pt takes a daily baby ASA.  Patient is statin intolerant-he has been switched over to [facility] and tolerating.      01/13/2023    8:52 AM 12/20/2021    2:54 PM 09/19/2020    1:27 PM 11/18/2019    8:04 AM 09/29/2016    8:22 AM  Depression screen PHQ 2/9  Decreased Interest 0 0 0 0 0  Down, Depressed, Hopeless 0 0 0 0 0  PHQ - 2 Score 0 0 0 0 0    Allergies  Allergen Reactions   Lipitor [Atorvastatin] Other (See Comments)    Elevated LFTs, elevated CK with atorvastatin   Lisinopril Cough   Social History   Social History Narrative   Married to [place]. No children.    BS. Div. Chief Financial Officer.    Drinks caffeine.    Wears seatbelt, bicycle helmet. Smoke detector in the home.    Exercises 3x a week.    Firearms locked in the home.    Feels safe in his relationships.    Past Medical History:  Diagnosis Date   Allergy    Asthma    Atypical chest pain 12/12/2019   COVID-19 09/2019   Elevated CK 12/02/2019   Elevated LFTs 12/02/2019   Essential hypertension, benign 09/01/2016   Gastroesophageal reflux disease without esophagitis 09/10/2016   GERD (gastroesophageal reflux disease)    Globus sensation 12/30/2019   Hepatic steatosis 12/18/2020   History of Helicobacter infection 12/18/2020   Hyperlipidemia     Hypertension    Mixed hyperlipidemia 12/02/2019   Morbid obesity with BMI of 40.0-44.9, adult (HCC) 03/29/2021   OSA on CPAP 01/19/2017   Trial of CPAP therapy on 17 cm H2O with EPR 2, a Medium size [employer]Paykel Full Face Mask Simplus mask and heated humidification. - Avoid alcohol, sedatives and other CNS depressants that may worsen sleep apnea and disrupt normal sleep architecture. - Sleep hygiene should be reviewed to assess factors that may improve sleep quality. - Weight management and regular exercise should be initiated or c   Prediabetes 09/29/2016   Retroperitoneal lymphadenopathy 01/07/2021   reactive only- saw onc   Severe obesity (BMI >= 40) (HCC) 09/01/2016   Sleep apnea with use of continuous positive airway pressure (CPAP)    Statin intolerance 03/29/2021   Tachycardia 09/01/2016   Past Surgical History:  Procedure Laterality Date   NO PAST SURGERIES     Family History  Problem Relation Age of Onset   Hypertension Father    Arthritis Father    GER disease Father    Colon cancer Neg Hx    Esophageal cancer Neg Hx    Pancreatic cancer Neg Hx    Rectal cancer Neg Hx    Stomach cancer Neg Hx    Allergies as of 12/15/2023       Reactions  Lipitor [atorvastatin] Other (See Comments)   Elevated LFTs, elevated CK with atorvastatin   Lisinopril Cough        Medication List        Accurate as of December 15, 2023 10:12 AM. If you have any questions, ask your nurse or doctor.          STOP taking these medications    buPROPion 150 MG 24 hr tablet Commonly known as: Wellbutrin XL Stopped by: Felix Pacini   omeprazole 40 MG capsule Commonly known as: PRILOSEC Stopped by: Felix Pacini       TAKE these medications    Aspirin Adult Low Strength 81 MG tablet Generic drug: aspirin EC Take 1 tablet (81 mg total) by mouth daily.   colesevelam 625 MG tablet Commonly known as: WELCHOL TAKE 2 TABLETS BY MOUTH 2 TIMES DAILY WITH A MEAL.   hydrochlorothiazide  25 MG tablet Commonly known as: HYDRODIURIL Take 1 tablet (25 mg total) by mouth daily.   losartan 100 MG tablet Commonly known as: COZAAR Take 1 tablet (100 mg total) by mouth daily.   Metoprolol Tartrate 37.5 MG Tabs Take 1 tablet (37.5 mg total) by mouth 2 (two) times daily. What changed:  medication strength how much to take Changed by: Felix Pacini        All past medical history, surgical history, allergies, family history, immunizations andmedications were updated in the EMR today and reviewed under the history and medication portions of their EMR.     ROS: Negative, with the exception of above mentioned in HPI   Objective:  BP 134/86   Pulse 78   Temp 98.1 F (36.7 C)   Wt (!) 308 lb (139.7 kg)   SpO2 95%   BMI 45.10 kg/m  Body mass index is 45.1 kg/m. Physical Exam Vitals and nursing note reviewed.  Constitutional:      General: He is not in acute distress.    Appearance: Normal appearance. He is not ill-appearing, toxic-appearing or diaphoretic.  HENT:     Head: Normocephalic and atraumatic.  Eyes:     General: No scleral icterus.       Right eye: No discharge.        Left eye: No discharge.     Extraocular Movements: Extraocular movements intact.     Pupils: Pupils are equal, round, and reactive to light.  Cardiovascular:     Rate and Rhythm: Normal rate and regular rhythm.  Pulmonary:     Effort: Pulmonary effort is normal. No respiratory distress.     Breath sounds: Normal breath sounds. No wheezing, rhonchi or rales.  Musculoskeletal:     Right lower leg: No edema.     Left lower leg: No edema.  Skin:    General: Skin is warm.     Findings: No rash.  Neurological:     Mental Status: He is alert and oriented to person, place, and time. Mental status is at baseline.  Psychiatric:        Mood and Affect: Mood normal.        Behavior: Behavior normal.        Thought Content: Thought content normal.        Judgment: Judgment normal.     No  results found. No results found. No results found for this or any previous visit (from the past 24 hours).  Assessment/Plan: [name] is a 48 y.o. male present for OV for Chronic Conditions/illness Management Essential hypertension, benign/Severe obesity (BMI >=  40) (HCC)/morbid obesity/statin myalgia Increasing, weight improving.  Continue HCTZ 25 mg QD Continue losartan 50 mg QD increase metoprolol 25 mg BID> 37.5 bid Continue WelChol Diet/exercise Labs due next visit  OSA Compliant with CPAP and follows with pulmonology  Reviewed expectations re: course of current medical issues. Discussed self-management of symptoms. Outlined signs and symptoms indicating need for more acute intervention. Patient verbalized understanding and all questions were answered. Patient received an After-Visit Summary.    No orders of the defined types were placed in this encounter.   Meds ordered this encounter  Medications   hydrochlorothiazide (HYDRODIURIL) 25 MG tablet    Sig: Take 1 tablet (25 mg total) by mouth daily.    Dispense:  90 tablet    Refill:  1   losartan (COZAAR) 100 MG tablet    Sig: Take 1 tablet (100 mg total) by mouth daily.    Dispense:  90 tablet    Refill:  1   metoprolol tartrate 37.5 MG TABS    Sig: Take 1 tablet (37.5 mg total) by mouth 2 (two) times daily.    Dispense:  180 tablet    Refill:  1    Referral Orders  No referral(s) requested today     Note is dictated utilizing voice recognition software. Although note has been proof read prior to signing, occasional typographical errors still can be missed. If any questions arise, please do not hesitate to call for verification.   electronically signed by:  Felix Pacini, DO  [facility] [place]

## 2023-12-15 NOTE — Patient Instructions (Addendum)
 Return in about 14 weeks (around 03/22/2024) for cpe (20 min), Routine chronic condition follow-up. Labs due next visit       Great to see you today.  I have refilled the medication(s) we provide.   If labs were collected or images ordered, we will inform you of  results once we have received them and reviewed. We will contact you either by echart message, or telephone call.  Please give ample time to the testing facility, and our office to run,  receive and review results. Please do not call inquiring of results, even if you can see them in your chart. We will contact you as soon as we are able. If it has been over 1 week since the test was completed, and you have not yet heard from [place], then please call us.    - echart message- for normal results that have been seen by the patient already.   - telephone call: abnormal results or if patient has not viewed results in their echart.  If a referral to a specialist was entered for you, please call us in 2 weeks if you have not heard from the specialist office to schedule.

## 2023-12-18 ENCOUNTER — Ambulatory Visit: Payer: 59 | Admitting: Family Medicine

## 2023-12-19 ENCOUNTER — Other Ambulatory Visit: Payer: Self-pay | Admitting: Family Medicine

## 2023-12-21 ENCOUNTER — Encounter: Payer: Self-pay | Admitting: Family Medicine

## 2023-12-21 MED ORDER — METOPROLOL SUCCINATE ER 50 MG PO TB24: 50.0000 mg | ORAL_TABLET | Freq: Every day | ORAL | 1 refills | Status: DC

## 2023-12-21 NOTE — Telephone Encounter (Signed)
 Please inform patient have called in metoprolol 50 mg daily.  It is an extended release tablet so he needs to take once a day after a meal

## 2024-01-02 ENCOUNTER — Other Ambulatory Visit: Payer: Self-pay | Admitting: Family Medicine

## 2024-01-07 ENCOUNTER — Ambulatory Visit: Payer: 59 | Admitting: Pulmonary Disease

## 2024-01-25 ENCOUNTER — Encounter: Payer: Self-pay | Admitting: Family Medicine

## 2024-01-25 ENCOUNTER — Other Ambulatory Visit: Payer: Self-pay | Admitting: Family Medicine

## 2024-01-25 MED ORDER — COLESEVELAM HCL 625 MG PO TABS: ORAL_TABLET | ORAL | 11 refills | Status: AC

## 2024-02-17 ENCOUNTER — Ambulatory Visit: Admitting: Pulmonary Disease

## 2024-02-17 ENCOUNTER — Encounter: Payer: Self-pay | Admitting: Pulmonary Disease

## 2024-02-17 VITALS — BP 129/79 | HR 60 | Temp 98.4°F | Ht 69.0 in | Wt 314.8 lb

## 2024-02-17 DIAGNOSIS — E669 Obesity, unspecified: Secondary | ICD-10-CM

## 2024-02-17 DIAGNOSIS — G4733 Obstructive sleep apnea (adult) (pediatric): Secondary | ICD-10-CM

## 2024-02-17 DIAGNOSIS — Z6841 Body Mass Index (BMI) 40.0 and over, adult: Secondary | ICD-10-CM | POA: Diagnosis not present

## 2024-02-17 NOTE — Patient Instructions (Signed)
 Continue using CPAP on a nightly basis  Cloth mask might help with the skin rash you are getting from your CPAP mask  Using a mouth tape might also help you transition to nasal pillows  I will see you back in a year  Call/message us  with any significant concerns

## 2024-02-17 NOTE — Progress Notes (Signed)
 Subjective:    Patient ID: Christian Escobar, male    DOB: 1975-11-01, 48 y.o.   MRN: [id number]  Patient with severe obstructive sleep apnea Using CPAP regularly  Has been doing well  Pressure was changed from a previous pressure of 17 down to 14 which she continues to tolerate well Gets about 70 of hours of sleep nightly Wakes up feeling like he is at a good nights rest  He continues to have some issues with his CPAP mask Skin breakdown around his nasal bridge and face  Continues to work on weight loss efforts  History of hypertension, asthma, hypercholesterolemia  Benefits from CPAP use     Past Medical History:  Diagnosis Date   Allergy    Asthma    Atypical chest pain 12/12/2019   COVID-19 09/2019   Elevated CK 12/02/2019   Elevated LFTs 12/02/2019   Essential hypertension, benign 09/01/2016   Gastroesophageal reflux disease without esophagitis 09/10/2016   GERD (gastroesophageal reflux disease)    Globus sensation 12/30/2019   Hepatic steatosis 12/18/2020   History of Helicobacter infection 12/18/2020   Hyperlipidemia    Hypertension    Mixed hyperlipidemia 12/02/2019   Morbid obesity with BMI of 40.0-44.9, adult (HCC) 03/29/2021   OSA on CPAP 01/19/2017   Trial of CPAP therapy on 17 cm H2O with EPR 2, a Medium size Fisher&Paykel Full Face Mask Simplus mask and heated humidification. - Avoid alcohol, sedatives and other CNS depressants that may worsen sleep apnea and disrupt normal sleep architecture. - Sleep hygiene should be reviewed to assess factors that may improve sleep quality. - Weight management and regular exercise should be initiated or c   Prediabetes 09/29/2016   Retroperitoneal lymphadenopathy 01/07/2021   reactive only- saw onc   Severe obesity (BMI >= 40) (HCC) 09/01/2016   Sleep apnea with use of continuous positive airway pressure (CPAP)    Statin intolerance 03/29/2021   Tachycardia 09/01/2016   Social History   Socioeconomic History   Marital  status: Married    Spouse name: Dana Duncan   Number of children: 0   Years of education: 16   Highest education level: Bachelor's degree (e.g., BA, AB, BS)  Occupational History   Occupation: Administrator, sports  Tobacco Use   Smoking status: Never   Smokeless tobacco: Never  Vaping Use   Vaping status: Never Used  Substance and Sexual Activity   Alcohol use: No   Drug use: No   Sexual activity: Yes    Partners: Female    Comment: married  Other Topics Concern   Not on file  Social History Narrative   Married to [place]. No children.    BS. Div. Chief Financial Officer.    Drinks caffeine.    Wears seatbelt, bicycle helmet. Smoke detector in the home.    Exercises 3x a week.    Firearms locked in the home.    Feels safe in his relationships.    Social Drivers of Corporate investment banker Strain: Low Risk  (12/11/2023)   Overall Financial Resource Strain (CARDIA)    Difficulty of Paying Living Expenses: Not hard at all  Food Insecurity: No Food Insecurity (12/11/2023)   Hunger Vital Sign    Worried About Running Out of Food in the Last Year: Never true    Ran Out of Food in the Last Year: Never true  Transportation Needs: No Transportation Needs (12/11/2023)   PRAPARE - Administrator, Civil Service (Medical): No  Lack of Transportation (Non-Medical): No  Physical Activity: Insufficiently Active (12/11/2023)   Exercise Vital Sign    Days of Exercise per Week: 4 days    Minutes of Exercise per Session: 30 min  Stress: Stress Concern Present (12/11/2023)   [employer] of Occupational Health - Occupational Stress Questionnaire    Feeling of Stress : To some extent  Social Connections: Socially Isolated (12/11/2023)   Social Connection and Isolation Panel [NHANES]    Frequency of Communication with Friends and Family: Never    Frequency of Social Gatherings with Friends and Family: Never    Attends Religious Services: Never    Database administrator or  Organizations: No    Attends Engineer, structural: Not on file    Marital Status: Married  Intimate Partner Violence: Unknown (01/23/2022)   Received from [employer], [facility]   HITS    Physically Hurt: Not on file    Insult or Talk Down To: Not on file    Threaten Physical Harm: Not on file    Scream or Curse: Not on file   Family History  Problem Relation Age of Onset   Hypertension Father    Arthritis Father    GER disease Father    Colon cancer Neg Hx    Esophageal cancer Neg Hx    Pancreatic cancer Neg Hx    Rectal cancer Neg Hx    Stomach cancer Neg Hx    Review of Systems  Constitutional:  Positive for unexpected weight change.  Respiratory:  Positive for apnea.   Cardiovascular:  Negative for chest pain.  Psychiatric/Behavioral:  Positive for sleep disturbance.   All other systems reviewed and are negative.     Objective:   Physical Exam Constitutional:      Appearance: He is obese.  HENT:     Head: Normocephalic.     Nose:     Comments: Rash around bridge of nose and face    Mouth/Throat:     Mouth: Mucous membranes are moist.     Comments: Crowded, Mallampati 2 Eyes:     General: No scleral icterus. Neck:     Comments: Neck size 18 Cardiovascular:     Pulses: Normal pulses.     Heart sounds: Normal heart sounds. No murmur heard. Pulmonary:     Effort: Pulmonary effort is normal. No respiratory distress.     Breath sounds: Normal breath sounds. No stridor. No wheezing or rhonchi.  Musculoskeletal:     Cervical back: No rigidity.  Neurological:     Mental Status: He is alert.  Psychiatric:        Mood and Affect: Mood normal.    Vitals:   02/17/24 1057  BP: 129/79  Pulse: 60  Temp: 98.4 F (36.9 C)  SpO2: 96%      12/07/2019    9:00 AM  Results of the Epworth flowsheet  Sitting and reading 2  Watching TV 2  Sitting, inactive in a public place (e.g. a theatre or a meeting) 0  As a passenger in a car for an hour without a  break 0  Lying down to rest in the afternoon when circumstances permit 2  Sitting and talking to someone 0  Sitting quietly after a lunch without alcohol 1  In a car, while stopped for a few minutes in traffic 0  Total score 7      Compliance reviewed showing excellent compliance Average use of 7 hours 27 minutes CPAP  of 14 Residual AHI of 0.8  Assessment & Plan:   Severe obstructive sleep apnea -Currently on CPAP of 14 Tolerating it well Benefiting from CPAP use  Obesity - He continues to work on weight loss efforts  Plan: Continue CPAP use  May want to try a cloth mask  May consider nasal pillows with a mouth tape  Follow-up a year from now  Encouraged to call us  with significant concerns

## 2024-03-13 ENCOUNTER — Other Ambulatory Visit: Payer: Self-pay | Admitting: Family Medicine

## 2024-03-16 NOTE — Telephone Encounter (Signed)
 New rx currently for 50mg , last refilled 3/3 for 6 month supply

## 2024-03-17 ENCOUNTER — Encounter: Payer: Self-pay | Admitting: Family Medicine

## 2024-03-18 ENCOUNTER — Other Ambulatory Visit: Payer: Self-pay | Admitting: Family Medicine

## 2024-03-18 ENCOUNTER — Other Ambulatory Visit: Payer: Self-pay

## 2024-03-18 MED ORDER — ASPIRIN ADULT LOW STRENGTH 81 MG PO TBEC: 81.0000 mg | DELAYED_RELEASE_TABLET | Freq: Every day | ORAL | 0 refills | Status: DC

## 2024-03-18 MED ORDER — METOPROLOL SUCCINATE ER 50 MG PO TB24: 50.0000 mg | ORAL_TABLET | Freq: Every day | ORAL | 0 refills | Status: DC

## 2024-03-25 ENCOUNTER — Ambulatory Visit: Payer: 59 | Admitting: Family Medicine

## 2024-04-10 ENCOUNTER — Other Ambulatory Visit: Payer: Self-pay | Admitting: Family Medicine

## 2024-04-14 ENCOUNTER — Other Ambulatory Visit: Payer: Self-pay | Admitting: Family Medicine

## 2024-04-18 ENCOUNTER — Ambulatory Visit: Admitting: Family Medicine

## 2024-05-04 ENCOUNTER — Ambulatory Visit: Admitting: Family Medicine

## 2024-05-18 ENCOUNTER — Other Ambulatory Visit: Payer: Self-pay | Admitting: Family Medicine

## 2024-06-12 ENCOUNTER — Other Ambulatory Visit: Payer: Self-pay | Admitting: Family Medicine

## 2024-06-14 ENCOUNTER — Encounter: Payer: Self-pay | Admitting: Family Medicine

## 2024-06-14 ENCOUNTER — Ambulatory Visit: Admitting: Family Medicine

## 2024-06-14 VITALS — BP 126/82 | HR 65 | Temp 98.3°F | Wt 299.6 lb

## 2024-06-14 DIAGNOSIS — G4733 Obstructive sleep apnea (adult) (pediatric): Secondary | ICD-10-CM

## 2024-06-14 DIAGNOSIS — E782 Mixed hyperlipidemia: Secondary | ICD-10-CM

## 2024-06-14 DIAGNOSIS — R7303 Prediabetes: Secondary | ICD-10-CM

## 2024-06-14 DIAGNOSIS — M791 Myalgia, unspecified site: Secondary | ICD-10-CM

## 2024-06-14 DIAGNOSIS — I10 Essential (primary) hypertension: Secondary | ICD-10-CM | POA: Diagnosis not present

## 2024-06-14 DIAGNOSIS — T466X5A Adverse effect of antihyperlipidemic and antiarteriosclerotic drugs, initial encounter: Secondary | ICD-10-CM

## 2024-06-14 DIAGNOSIS — Z6841 Body Mass Index (BMI) 40.0 and over, adult: Secondary | ICD-10-CM

## 2024-06-14 LAB — COMPREHENSIVE METABOLIC PANEL WITH GFR
ALT: 40 U/L (ref 0–53)
AST: 24 U/L (ref 0–37)
Albumin: 4.9 g/dL (ref 3.5–5.2)
Alkaline Phosphatase: 55 U/L (ref 39–117)
BUN: 15 mg/dL (ref 6–23)
CO2: 31 meq/L (ref 19–32)
Calcium: 10.1 mg/dL (ref 8.4–10.5)
Chloride: 99 meq/L (ref 96–112)
Creatinine, Ser: 1.14 mg/dL (ref 0.40–1.50)
GFR: 77.16 mL/min (ref >60.00)
Glucose, Bld: 84 mg/dL (ref 70–99)
Potassium: 3.7 meq/L (ref 3.5–5.1)
Sodium: 141 meq/L (ref 135–145)
Total Bilirubin: 0.8 mg/dL (ref 0.2–1.2)
Total Protein: 7.6 g/dL (ref 6.0–8.3)

## 2024-06-14 LAB — CBC
HCT: 45.4 % (ref 39.0–52.0)
Hemoglobin: 15.3 g/dL (ref 13.0–17.0)
MCHC: 33.7 g/dL (ref 30.0–36.0)
MCV: 87.9 fl (ref 78.0–100.0)
Platelets: 262 K/uL (ref 150.0–400.0)
RBC: 5.17 Mil/uL (ref 4.22–5.81)
RDW: 13.6 % (ref 11.5–15.5)
WBC: 6.3 K/uL (ref 4.0–10.5)

## 2024-06-14 LAB — LIPID PANEL
Cholesterol: 157 mg/dL (ref 0–200)
HDL: 32.9 mg/dL — ABNORMAL LOW (ref >39.00)
LDL Cholesterol: 96 mg/dL (ref 0–99)
NonHDL: 124.2
Total CHOL/HDL Ratio: 5
Triglycerides: 142 mg/dL (ref 0.0–149.0)
VLDL: 28.4 mg/dL (ref 0.0–40.0)

## 2024-06-14 LAB — TSH: TSH: 2.24 u[IU]/mL (ref 0.35–5.50)

## 2024-06-14 LAB — HEMOGLOBIN A1C: Hgb A1c MFr Bld: 5.4 % (ref 4.6–6.5)

## 2024-06-14 MED ORDER — METOPROLOL SUCCINATE ER 50 MG PO TB24
50.0000 mg | ORAL_TABLET | Freq: Every day | ORAL | 1 refills | Status: AC
Start: 2024-06-14 — End: ?

## 2024-06-14 MED ORDER — ASPIRIN LOW DOSE 81 MG PO TBEC: 81.0000 mg | DELAYED_RELEASE_TABLET | Freq: Every day | ORAL | 3 refills | Status: AC

## 2024-06-14 MED ORDER — HYDROCHLOROTHIAZIDE 25 MG PO TABS: 25.0000 mg | ORAL_TABLET | Freq: Every day | ORAL | 1 refills | Status: AC

## 2024-06-14 MED ORDER — LOSARTAN POTASSIUM 100 MG PO TABS: 100.0000 mg | ORAL_TABLET | Freq: Every day | ORAL | 1 refills | Status: AC

## 2024-06-14 NOTE — Progress Notes (Signed)
 Christian Escobar , August 17, 1976, 48 y.o., male MRN: [id number] Patient Care Team    Relationship Specialty Notifications Start End  Catherine Charlies LABOR, DO PCP - General Family Medicine  09/01/16   Lilian Sherron Marsa Mickey., MD Referring Physician Cardiology  12/01/19     Chief Complaint  Patient presents with   Hypertension     Subjective: Christian Escobar is a 48 y.o. male present for Chronic Conditions/illness Management Hypertension/Severe -morbidobesity/hyperlipidemia/tachy: Pt reports compliance with metoprolol  50 mg  daily, HCTZ 25 mg daily,  and Cozaar  100 mg daily.  Patient denies chest pain, shortness of breath, dizziness or lower extremity edema.   Pt takes a daily baby ASA.  Patient is statin intolerant-he has been switched over to WelChol  and tolerating.      12/15/2023   10:12 AM 01/13/2023    8:52 AM 12/20/2021    2:54 PM 09/19/2020    1:27 PM 11/18/2019    8:04 AM  Depression screen PHQ 2/9  Decreased Interest 0 0 0 0 0  Down, Depressed, Hopeless 0 0 0 0 0  PHQ - 2 Score 0 0 0 0 0      05/08/2020   11:33 AM 02/05/2021   11:31 AM 08/07/2021   11:21 AM 01/13/2023    8:52 AM 12/15/2023   10:12 AM  Fall Risk  Falls in the past year? 0   0 0  Was there an injury with Fall? 0   0 0  Fall Risk Category Calculator 0   0 0  Fall Risk Category (Retired) Low       (RETIRED) Patient Fall Risk Level Low fall risk  Low fall risk  Low fall risk     Patient at Risk for Falls Due to    No Fall Risks No Fall Risks  Fall risk Follow up    Falls evaluation completed Falls evaluation completed     Data saved with a previous flowsheet row definition    Allergies  Allergen Reactions   Lipitor [Atorvastatin ] Other (See Comments)    Elevated LFTs, elevated CK with atorvastatin    Lisinopril  Cough   Social History   Social History Narrative   Married to [place]. No children.    BS. Div. Chief Financial Officer.    Drinks caffeine.    Wears seatbelt, bicycle helmet. Smoke detector  in the home.    Exercises 3x a week.    Firearms locked in the home.    Feels safe in his relationships.    Past Medical History:  Diagnosis Date   Allergy    Asthma    Atypical chest pain 12/12/2019   COVID-19 09/2019   Elevated CK 12/02/2019   Elevated LFTs 12/02/2019   Essential hypertension, benign 09/01/2016   Gastroesophageal reflux disease without esophagitis 09/10/2016   GERD (gastroesophageal reflux disease)    Globus sensation 12/30/2019   Hepatic steatosis 12/18/2020   History of Helicobacter infection 12/18/2020   Hyperlipidemia    Hypertension    Mixed hyperlipidemia 12/02/2019   Morbid obesity with BMI of 40.0-44.9, adult (HCC) 03/29/2021   OSA on CPAP 01/19/2017   Trial of CPAP therapy on 17 cm H2O with EPR 2, a Medium size Fisher&Paykel Full Face Mask Simplus mask and heated humidification. - Avoid alcohol, sedatives and other CNS depressants that may worsen sleep apnea and disrupt normal sleep architecture. - Sleep hygiene should be reviewed to assess factors that may improve sleep quality. - Weight management and regular exercise  should be initiated or c   Prediabetes 09/29/2016   Retroperitoneal lymphadenopathy 01/07/2021   reactive only- saw onc   Severe obesity (BMI >= 40) (HCC) 09/01/2016   Sleep apnea with use of continuous positive airway pressure (CPAP)    Statin intolerance 03/29/2021   Tachycardia 09/01/2016   Past Surgical History:  Procedure Laterality Date   NO PAST SURGERIES     Family History  Problem Relation Age of Onset   Hypertension Father    Arthritis Father    GER disease Father    Colon cancer Neg Hx    Esophageal cancer Neg Hx    Pancreatic cancer Neg Hx    Rectal cancer Neg Hx    Stomach cancer Neg Hx    Allergies as of 06/14/2024       Reactions   Lipitor [atorvastatin ] Other (See Comments)   Elevated LFTs, elevated CK with atorvastatin    Lisinopril  Cough        Medication List        Accurate as of June 14, 2024  1:32  PM. If you have any questions, ask your nurse or doctor.          Aspirin  Low Dose 81 MG tablet Generic drug: aspirin  EC Take 1 tablet (81 mg total) by mouth daily.   colesevelam  625 MG tablet Commonly known as: WELCHOL  TAKE 2 TABLETS BY MOUTH 2 TIMES DAILY WITH A MEAL.   hydrochlorothiazide  25 MG tablet Commonly known as: HYDRODIURIL  Take 1 tablet (25 mg total) by mouth daily.   losartan  100 MG tablet Commonly known as: COZAAR  Take 1 tablet (100 mg total) by mouth daily.   metoprolol  succinate 50 MG 24 hr tablet Commonly known as: TOPROL -XL Take 1 tablet (50 mg total) by mouth daily. Take with or immediately following a meal.        All past medical history, surgical history, allergies, family history, immunizations andmedications were updated in the EMR today and reviewed under the history and medication portions of their EMR.     ROS: Negative, with the exception of above mentioned in HPI   Objective:  BP 126/82   Pulse 65   Temp 98.3 F (36.8 C)   Wt 299 lb 9.6 oz (135.9 kg)   SpO2 97%   BMI 44.24 kg/m  Body mass index is 44.24 kg/m. Physical Exam Vitals and nursing note reviewed.  Constitutional:      General: He is not in acute distress.    Appearance: Normal appearance. He is not ill-appearing, toxic-appearing or diaphoretic.  HENT:     Head: Normocephalic and atraumatic.  Eyes:     General: No scleral icterus.       Right eye: No discharge.        Left eye: No discharge.     Extraocular Movements: Extraocular movements intact.     Pupils: Pupils are equal, round, and reactive to light.  Cardiovascular:     Rate and Rhythm: Normal rate and regular rhythm.     Heart sounds: No murmur heard. Pulmonary:     Effort: Pulmonary effort is normal. No respiratory distress.     Breath sounds: Normal breath sounds. No wheezing, rhonchi or rales.  Musculoskeletal:     Right lower leg: No edema.     Left lower leg: No edema.  Skin:    General: Skin is  warm.     Findings: No rash.  Neurological:     Mental Status: He is alert and oriented  to person, place, and time. Mental status is at baseline.  Psychiatric:        Mood and Affect: Mood normal.        Behavior: Behavior normal.        Thought Content: Thought content normal.        Judgment: Judgment normal.     No results found. No results found. No results found for this or any previous visit (from the past 24 hours).  Assessment/Plan: Christian Escobar is a 48 y.o. male present for OV for Chronic Conditions/illness Management Essential hypertension, benign/Severe obesity (BMI >= 40) (HCC)/morbid obesity/statin myalgia stable Continue HCTZ 25 mg QD Continue losartan  100 mg QD increase metoprolol  50 XL qd Continue WelChol  Diet/exercise Labs due today  OSA Compliant with CPAP and follows with pulmonology  Reviewed expectations re: course of current medical issues. Discussed self-management of symptoms. Outlined signs and symptoms indicating need for more acute intervention. Patient verbalized understanding and all questions were answered. Patient received an After-Visit Summary.    Orders Placed This Encounter  Procedures   CBC   Comp Met (CMET)   TSH   Lipid panel   Hemoglobin A1c    Meds ordered this encounter  Medications   hydrochlorothiazide  (HYDRODIURIL ) 25 MG tablet    Sig: Take 1 tablet (25 mg total) by mouth daily.    Dispense:  90 tablet    Refill:  1   losartan  (COZAAR ) 100 MG tablet    Sig: Take 1 tablet (100 mg total) by mouth daily.    Dispense:  90 tablet    Refill:  1   metoprolol  succinate (TOPROL -XL) 50 MG 24 hr tablet    Sig: Take 1 tablet (50 mg total) by mouth daily. Take with or immediately following a meal.    Dispense:  90 tablet    Refill:  1   ASPIRIN  LOW DOSE 81 MG tablet    Sig: Take 1 tablet (81 mg total) by mouth daily.    Dispense:  90 tablet    Refill:  3    Referral Orders  No referral(s) requested today     Note  is dictated utilizing voice recognition software. Although note has been proof read prior to signing, occasional typographical errors still can be missed. If any questions arise, please do not hesitate to call for verification.   electronically signed by:  Charlies Bellini, DO  [facility] [place]

## 2024-06-14 NOTE — Patient Instructions (Addendum)
 Return in about 24 weeks (around 11/29/2024) for Routine chronic condition follow-up.        Great to see you today.  I have refilled the medication(s) we provide.   If labs were collected or images ordered, we will inform you of  results once we have received them and reviewed. We will contact you either by echart message, or telephone call.  Please give ample time to the testing facility, and our office to run,  receive and review results. Please do not call inquiring of results, even if you can see them in your chart. We will contact you as soon as we are able. If it has been over 1 week since the test was completed, and you have not yet heard from us , then please call us .    - echart message- for normal results that have been seen by the patient already.   - telephone call: abnormal results or if patient has not viewed results in their echart.  If a referral to a specialist was entered for you, please call us  in 2 weeks if you have not heard from the specialist office to schedule.

## 2024-06-15 ENCOUNTER — Ambulatory Visit: Admitting: Family Medicine

## 2024-06-15 ENCOUNTER — Ambulatory Visit: Payer: Self-pay | Admitting: Family Medicine
